# Patient Record
Sex: Female | Born: 1990
Health system: Southern US, Community
[De-identification: ages and names within clinical notes are randomized; demographics above are authoritative.]

## PROBLEM LIST (undated history)

## (undated) DIAGNOSIS — A64 Unspecified sexually transmitted disease: Secondary | ICD-10-CM

## (undated) DIAGNOSIS — Z98891 History of uterine scar from previous surgery: Secondary | ICD-10-CM

## (undated) DIAGNOSIS — A4902 Methicillin resistant Staphylococcus aureus infection, unspecified site: Secondary | ICD-10-CM

## (undated) HISTORY — PX: WISDOM TOOTH EXTRACTION: SHX21

## (undated) HISTORY — DX: Unspecified sexually transmitted disease: A64

---

## 2013-01-31 ENCOUNTER — Other Ambulatory Visit (HOSPITAL_COMMUNITY)
Admission: RE | Admit: 2013-01-31 | Discharge: 2013-01-31 | Disposition: A | Discharge status: Home / Self Care | Payer: BC Managed Care – PPO | Source: Ambulatory Visit | Attending: Emergency Medicine | Admitting: Emergency Medicine

## 2013-01-31 ENCOUNTER — Encounter (HOSPITAL_COMMUNITY): Payer: Self-pay | Admitting: Emergency Medicine

## 2013-01-31 ENCOUNTER — Emergency Department (HOSPITAL_COMMUNITY)
Admission: EM | Admit: 2013-01-31 | Discharge: 2013-01-31 | Disposition: A | Discharge status: Home / Self Care | Payer: BC Managed Care – PPO | Source: Home / Self Care | Attending: Emergency Medicine | Admitting: Emergency Medicine

## 2013-01-31 DIAGNOSIS — N760 Acute vaginitis: Secondary | ICD-10-CM

## 2013-01-31 DIAGNOSIS — Z113 Encounter for screening for infections with a predominantly sexual mode of transmission: Secondary | ICD-10-CM | POA: Insufficient documentation

## 2013-01-31 LAB — POCT URINALYSIS DIP (DEVICE)
Bilirubin Urine: NEGATIVE
Hgb urine dipstick: NEGATIVE
Nitrite: NEGATIVE
Specific Gravity, Urine: <=1.005 (ref 1.005–1.030)
pH: 6.5 (ref 5.0–8.0)

## 2013-01-31 MED ORDER — FLUCONAZOLE 150 MG PO TABS: 150.0000 mg | ORAL_TABLET | Freq: Once | ORAL | Status: DC

## 2013-01-31 NOTE — ED Notes (Signed)
States she has discharge and her redness in her vagina.  Patient says she is not itching.

## 2013-01-31 NOTE — Discharge Instructions (Signed)
We will; contact you if any abnormal test results-

## 2013-01-31 NOTE — ED Provider Notes (Addendum)
History    CSN: [id number] Arrival date & time 01/31/13  1901  First MD Initiated Contact with Patient 01/31/13 2022     Chief Complaint  Patient presents with  . Vaginitis   (Consider location/radiation/quality/duration/timing/severity/associated sxs/prior Treatment) HPI Comments: Patient presents to urgent care describing for a few days she has been noticing a whitish discharge and redness in her vagina. Patient does describe that at one point was somewhat itchy but is not itchy anymore. She has very minimal concerns about having been exposed to an STD probe was to be tested for such.  Patient denies any pelvic pain, flank pain or associated urinary symptoms such as increased frequency pain or pressure or burning with urination.  Patient is a 22 y.o. female presenting with vaginal discharge.  Vaginal Discharge Quality:  Mucoid, thin and white Timing:  Constant Progression:  Worsening Context: not after intercourse   Associated symptoms: vaginal itching   Associated symptoms: no abdominal pain, no dyspareunia, no dysuria, no fever, no nausea, no rash and no vomiting   Risk factors: no endometriosis, no immunosuppression, no prior miscarriage, no STI, no STI exposure and no terminated pregnancy    History reviewed. No pertinent past medical history. No past surgical history on file. No family history on file. History  Substance Use Topics  . Smoking status: Not on file  . Smokeless tobacco: Not on file  . Alcohol Use: Not on file   OB History   Grav Para Term Preterm Abortions TAB SAB Ect Mult Living                 Review of Systems  Constitutional: Negative for fever, activity change and appetite change.  Eyes: Negative for photophobia and visual disturbance.  Gastrointestinal: Negative for nausea, vomiting and abdominal pain.  Genitourinary: Positive for vaginal discharge. Negative for dysuria, decreased urine volume, vaginal bleeding, vaginal pain, pelvic pain and  dyspareunia.  Skin: Negative for rash and wound.  Allergic/Immunologic: Negative for immunocompromised state.    Allergies  Review of patient's allergies indicates no known allergies.  Home Medications   Current Outpatient Rx  Name  Route  Sig  Dispense  Refill  . fluconazole (DIFLUCAN) 150 MG tablet   Oral   Take 1 tablet (150 mg total) by mouth once.   2 tablet   0    BP 136/85  Pulse 77  Temp(Src) 98.7 F (37.1 C)  Resp 18  SpO2 100% Physical Exam  Nursing note and vitals reviewed. Constitutional: She appears well-developed and well-nourished.  Genitourinary:    No erythema, tenderness or bleeding around the vagina. No foreign body around the vagina. No signs of injury around the vagina. Vaginal discharge found.  Skin: No rash noted.    ED Course  Procedures (including critical care time) Labs Reviewed  POCT URINALYSIS DIP (DEVICE) - Abnormal; Notable for the following:    Leukocytes, UA TRACE (*)    All other components within normal limits  HERPES SIMPLEX VIRUS CULTURE  POCT PREGNANCY, URINE  CERVICOVAGINAL ANCILLARY ONLY  CERVICOVAGINAL ANCILLARY ONLY   No results found. 1. Vaginitis and vulvovaginitis     MDM  Problem #1 Vaginal discharge and irritation  Exam was highly suggestive of Candida vaginitis. Samples have been sent for other STD screenings as patient has other concerns.  Propose empirical treatment was with Diflucan to be taken once today and to repeat treatment cycle in 7 days.  Patient has been informed that she will be contacted if abnormal  test results will require further treatment or intervention. Patient verbalized understanding and agrees with treatment plan.  Discharge Medication List as of 01/31/2013  8:39 PM    START taking these medications   Details  fluconazole (DIFLUCAN) 150 MG tablet Take 1 tablet (150 mg total) by mouth once., Starting 01/31/2013, Print        Jimmie Molly, MD 01/31/13 2130  Jimmie Molly, MD 02/04/13  [phone]

## 2013-02-03 LAB — HERPES SIMPLEX VIRUS CULTURE

## 2013-02-03 NOTE — ED Notes (Addendum)
GC/Chlamydia neg., Affirm: Candida and Gardnerella pos., Trich neg., Herpes culture: No herpes virus detected.  Pt. adequately treated with Diflucan. Message to Dr. Ladon Applebaum about Gardnerella. Vassie Moselle 02/03/2013

## 2013-02-04 MED ORDER — METRONIDAZOLE 500 MG PO TABS: 500.0000 mg | ORAL_TABLET | Freq: Two times a day (BID) | ORAL | Status: DC

## 2013-02-05 ENCOUNTER — Telehealth (HOSPITAL_COMMUNITY): Payer: Self-pay | Admitting: *Deleted

## 2013-02-05 NOTE — ED Notes (Addendum)
7/12  Dr. Ladon Applebaum entered order for Flagyl, if pt. symptomatic. 7/13 I called pt. and left a message to call. Pt. called back.  Pt. verified x 2 and given results.  She said she still had a little discharge but it is white, thick and clumpy.  I told her that sounds like yeast. She said she took her 2nd Diflucan yesterday. I told her to call back tomorrow. If she still has discharge I will call her Rx. to the [facility] on [address]. Pt. voiced understanding. Vassie Moselle 02/05/2013

## 2013-02-07 NOTE — ED Provider Notes (Signed)
   History    CSN: [id number] Arrival date & time 01/31/13  1901  First MD Initiated Contact with Patient 01/31/13 2022     Chief Complaint  Patient presents with  . Vaginitis   (Consider location/radiation/quality/duration/timing/severity/associated sxs/prior Treatment) HPI History reviewed. No pertinent past medical history. No past surgical history on file. No family history on file. History  Substance Use Topics  . Smoking status: Not on file  . Smokeless tobacco: Not on file  . Alcohol Use: Not on file   OB History   Grav Para Term Preterm Abortions TAB SAB Ect Mult Living                 Review of Systems  Allergies  Review of patient's allergies indicates no known allergies.  Home Medications   Current Outpatient Rx  Name  Route  Sig  Dispense  Refill  . fluconazole (DIFLUCAN) 150 MG tablet   Oral   Take 1 tablet (150 mg total) by mouth once.   2 tablet   0   . metroNIDAZOLE (FLAGYL) 500 MG tablet   Oral   Take 1 tablet (500 mg total) by mouth 2 (two) times daily.   14 tablet   0    BP 136/85  Pulse 77  Temp(Src) 98.7 F (37.1 C)  Resp 18  SpO2 100% Physical Exam  ED Course  Procedures (including critical care time) Labs Reviewed  POCT URINALYSIS DIP (DEVICE) - Abnormal; Notable for the following:    Leukocytes, UA TRACE (*)    All other components within normal limits  HERPES SIMPLEX VIRUS CULTURE  POCT PREGNANCY, URINE  CERVICOVAGINAL ANCILLARY ONLY  CERVICOVAGINAL ANCILLARY ONLY   No results found. 1. Vaginitis and vulvovaginitis     MDM  Result of BV given to me by Rosalita Chessman, RN. WIll tx with flagyl 500 bid for 7 d DMabe, NP  Hayden Rasmussen, NP 02/09/13 1351

## 2013-02-11 ENCOUNTER — Ambulatory Visit (INDEPENDENT_AMBULATORY_CARE_PROVIDER_SITE_OTHER): Payer: BC Managed Care – PPO | Admitting: Family Medicine

## 2013-02-11 VITALS — BP 116/70 | HR 104 | Temp 98.0°F | Resp 16 | Ht 62.25 in | Wt 189.0 lb

## 2013-02-11 DIAGNOSIS — Z113 Encounter for screening for infections with a predominantly sexual mode of transmission: Secondary | ICD-10-CM

## 2013-02-11 DIAGNOSIS — Z0000 Encounter for general adult medical examination without abnormal findings: Secondary | ICD-10-CM

## 2013-02-11 LAB — POCT CBC
Granulocyte percent: 61.2 % (ref 37–80)
HCT, POC: 41.7 % (ref 37.7–47.9)
Hemoglobin: 13.1 g/dL (ref 12.2–16.2)
Lymph, poc: 1.9 (ref 0.6–3.4)
MCH, POC: 31.9 pg — AB (ref 27–31.2)
MCHC: 31.4 g/dL — AB (ref 31.8–35.4)
MCV: 101.5 fL — AB (ref 80–97)
MID (cbc): 0.3 (ref 0–0.9)
MPV: 10.5 fL (ref 0–99.8)
POC Granulocyte: 3.5 (ref 2–6.9)
POC LYMPH PERCENT: 33.3 % (ref 10–50)
POC MID %: 5.5 %M (ref 0–12)
Platelet Count, POC: 184 10*3/uL (ref 142–424)
RBC: 4.11 M/uL (ref 4.04–5.48)
RDW, POC: 12.8 %
WBC: 5.7 10*3/uL (ref 4.6–10.2)

## 2013-02-11 LAB — COMPREHENSIVE METABOLIC PANEL
ALT: <8 U/L (ref 0–35)
AST: 12 U/L (ref 0–37)
Alkaline Phosphatase: 47 U/L (ref 39–117)
Creat: 0.88 mg/dL (ref 0.50–1.10)
Sodium: 139 mEq/L (ref 135–145)
Total Bilirubin: 0.4 mg/dL (ref 0.3–1.2)
Total Protein: 6.9 g/dL (ref 6.0–8.3)

## 2013-02-11 LAB — COMPREHENSIVE METABOLIC PANEL WITH GFR
Albumin: 4.3 g/dL (ref 3.5–5.2)
BUN: 8 mg/dL (ref 6–23)
CO2: 22 meq/L (ref 19–32)
Calcium: 9 mg/dL (ref 8.4–10.5)
Chloride: 105 meq/L (ref 96–112)
Glucose, Bld: 78 mg/dL (ref 70–99)
Potassium: 4 meq/L (ref 3.5–5.3)

## 2013-02-11 LAB — RPR

## 2013-02-11 LAB — HIV ANTIBODY (ROUTINE TESTING W REFLEX): HIV: NONREACTIVE

## 2013-02-11 LAB — HEPATITIS B SURFACE ANTIGEN: Hepatitis B Surface Ag: NEGATIVE

## 2013-02-11 LAB — HEPATITIS B SURFACE ANTIBODY, QUANTITATIVE: Hep B S AB Quant (Post): 1000 m[IU]/mL

## 2013-02-11 LAB — HEPATITIS C ANTIBODY: HCV Ab: NEGATIVE

## 2013-02-11 NOTE — Progress Notes (Signed)
[facility]:  Office Visit  Chief Complaint:  Chief Complaint  Patient presents with  . Annual Exam    and paperwork- no PAP    HPI: Kristine [name] is a 22 y.o. female who complains of:   She got her ob/gyn visit with  pap in Dec was normal , got it done in [place] She has been sexually active but not currently, has been on pills since 16 just went off pills, wanted to give body a break from OCP She is G0 She would like STI testing ? 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 No pertinent past medical history. History reviewed. No pertinent past surgical history. History   Social History  . Marital Status: Unknown    Spouse Name: N/A    Number of Children: N/A  . Years of Education: N/A   Social History Main Topics  . Smoking status: Never Smoker   . Smokeless tobacco: None  . Alcohol Use: 1.0 oz/week    2 drink(s) per week  . Drug Use: No  . Sexually Active: Yes    Birth Control/ Protection: Condom   Other Topics Concern  . None   Social History Narrative  . None   Family History  Problem Relation Age of Onset  . Multiple sclerosis Father    No Known Allergies Prior to Admission medications   Medication Sig Start Date End Date Taking? Authorizing Provider  fluconazole (DIFLUCAN) 150 MG tablet Take 1 tablet (150 mg total) by mouth once. 01/31/13   Jimmie Molly, MD  metroNIDAZOLE (FLAGYL) 500 MG tablet Take 1 tablet (500 mg total) by mouth 2 (two) times daily. 02/04/13   Jimmie Molly, MD     ROS: The patient denies fevers, chills, night sweats, unintentional weight loss, chest pain, palpitations, wheezing, dyspnea on exertion, nausea, vomiting, abdominal pain, dysuria, hematuria, melena, numbness, weakness, or tingling.   All other systems have been reviewed and were otherwise negative with the exception of those mentioned in the HPI and as above.    PHYSICAL EXAM: Filed  Vitals:   02/11/13 1327  BP: 116/70  Pulse: 104  Temp: 98 F (36.7 C)  Resp: 16   Filed Vitals:   02/11/13 1327  Height: 5' 2.25" (1.581 m)  Weight: 189 lb (85.73 kg)   Body mass index is 34.3 kg/(m^2).  General: Alert, no acute distress HEENT:  Normocephalic, atraumatic, oropharynx patent.  Cardiovascular:  Regular rate and rhythm, no rubs murmurs or gallops.  No Carotid bruits, radial pulse intact. No pedal edema.  Respiratory: Clear to auscultation bilaterally.  No wheezes, rales, or rhonchi.  No cyanosis, no use of accessory musculature GI: No organomegaly, abdomen is soft and non-tender, positive bowel sounds.  No masses. Skin: No rashes. Neurologic: Facial musculature symmetric. Psychiatric: Patient is appropriate throughout our interaction. Lymphatic: No cervical lymphadenopathy Musculoskeletal: Gait intact.   LABS: Results for orders placed in visit on 02/11/13  COMPREHENSIVE METABOLIC PANEL      Result Value Range   Sodium 139  135 - 145 mEq/L   Potassium 4.0  3.5 - 5.3 mEq/L   Chloride 105  96 - 112 mEq/L   CO2 22  19 - 32 mEq/L   Glucose, Bld 78  70 - 99 mg/dL   BUN 8  6 - 23 mg/dL   Creat 1.61  0.96 - 0.45 mg/dL   Total Bilirubin 0.4  0.3 -  1.2 mg/dL   Alkaline Phosphatase 47  39 - 117 U/L   AST 12  0 - 37 U/L   ALT <8  0 - 35 U/L   Total Protein 6.9  6.0 - 8.3 g/dL   Albumin 4.3  3.5 - 5.2 g/dL   Calcium 9.0  8.4 - 16.1 mg/dL  HEPATITIS B SURFACE ANTIBODY, QUANTITATIVE      Result Value Range   Hepatitis B-Post 1000.0    HEPATITIS B SURFACE ANTIGEN      Result Value Range   Hepatitis B Surface Ag NEGATIVE  NEGATIVE  HEPATITIS C ANTIBODY      Result Value Range   HCV Ab NEGATIVE  NEGATIVE  RPR      Result Value Range   RPR NON REAC  NON REAC  HIV ANTIBODY (ROUTINE TESTING)      Result Value Range   HIV NON REACTIVE  NON REACTIVE  POCT CBC      Result Value Range   WBC 5.7  4.6 - 10.2 K/uL   Lymph, poc 1.9  0.6 - 3.4   POC LYMPH PERCENT 33.3   10 - 50 %L   MID (cbc) 0.3  0 - 0.9   POC MID % 5.5  0 - 12 %M   POC Granulocyte 3.5  2 - 6.9   Granulocyte percent 61.2  37 - 80 %G   RBC 4.11  4.04 - 5.48 M/uL   Hemoglobin 13.1  12.2 - 16.2 g/dL   HCT, POC 09.6  04.5 - 47.9 %   MCV 101.5 (*) 80 - 97 fL   MCH, POC 31.9 (*) 27 - 31.2 pg   MCHC 31.4 (*) 31.8 - 35.4 g/dL   RDW, POC 40.9     Platelet Count, POC 184  142 - 424 K/uL   MPV 10.5  0 - 99.8 fL     EKG/XRAY:   Primary read interpreted by Dr. Conley Rolls at [facility].   ASSESSMENT/PLAN: Encounter Diagnoses  Name Primary?  . Annual physical exam Yes  . Screening for STD (sexually transmitted disease)    Labs pending STD labs pending Form filled out Will not do breast and pap exam since she had recent ob/gyn , patient does not want and she already has had recent one F/u prn    Amarri Michaelson PHUONG, DO 02/12/2013 10:07 AM

## 2013-02-11 NOTE — ED Provider Notes (Signed)
Medical screening examination/treatment/procedure(s) were performed by resident physician or non-physician practitioner and as supervising physician I was immediately available for consultation/collaboration.   Lyda Colcord,Hay DOUGLAS MD.   Bondar D Charlie Char, MD 02/11/13 0932 

## 2013-02-13 LAB — GC/CHLAMYDIA PROBE AMP
CT Probe RNA: NEGATIVE
GC Probe RNA: NEGATIVE

## 2013-03-29 ENCOUNTER — Encounter (HOSPITAL_COMMUNITY): Payer: Self-pay

## 2013-03-29 ENCOUNTER — Emergency Department (HOSPITAL_COMMUNITY)
Admission: EM | Admit: 2013-03-29 | Discharge: 2013-03-29 | Disposition: A | Discharge status: Home / Self Care | Payer: BC Managed Care – PPO | Source: Home / Self Care | Attending: Family Medicine | Admitting: Family Medicine

## 2013-03-29 DIAGNOSIS — S239XXA Sprain of unspecified parts of thorax, initial encounter: Secondary | ICD-10-CM

## 2013-03-29 DIAGNOSIS — H109 Unspecified conjunctivitis: Secondary | ICD-10-CM

## 2013-03-29 LAB — POCT URINALYSIS DIP (DEVICE)
Bilirubin Urine: NEGATIVE
Glucose, UA: NEGATIVE mg/dL
Hgb urine dipstick: NEGATIVE
Leukocytes, UA: NEGATIVE
Nitrite: NEGATIVE
Urobilinogen, UA: 1 mg/dL (ref 0.0–1.0)

## 2013-03-29 MED ORDER — TOBRAMYCIN 0.3 % OP SOLN: 1.0000 [drp] | Freq: Four times a day (QID) | OPHTHALMIC | Status: DC

## 2013-03-29 MED ORDER — METHOCARBAMOL 500 MG PO TABS: 500.0000 mg | ORAL_TABLET | Freq: Three times a day (TID) | ORAL | Status: DC

## 2013-03-29 MED ORDER — IBUPROFEN 800 MG PO TABS: 800.0000 mg | ORAL_TABLET | Freq: Three times a day (TID) | ORAL | Status: DC

## 2013-03-29 NOTE — ED Provider Notes (Signed)
CSN: [id number]     Arrival date & time 03/29/13  1632 History   First MD Initiated Contact with Patient 03/29/13 1655     Chief Complaint  Patient presents with  . Back Pain   (Consider location/radiation/quality/duration/timing/severity/associated sxs/prior Treatment) Patient is a 22 y.o. female presenting with back pain and conjunctivitis. The history is provided by the patient.  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 Relieved by:  Nothing Worsened by:  Nothing tried Ineffective treatments:  Heating pad Associated symptoms: no abdominal pain, no numbness and no paresthesias   Conjunctivitis This is a new problem. The current episode started yesterday. The problem has been gradually worsening (out break at school). Pertinent negatives include no abdominal pain.    History reviewed. No pertinent past medical history. History reviewed. No pertinent past surgical history. Family History  Problem Relation Age of Onset  . Multiple sclerosis Father    History  Substance Use Topics  . Smoking status: Never Smoker   . Smokeless tobacco: Not on file  . Alcohol Use: 1.0 oz/week    2 drink(s) per week   OB History   Grav Para Term Preterm Abortions TAB SAB Ect Mult Living                 Review of Systems  Constitutional: Negative.   Eyes: Positive for discharge and redness. Negative for photophobia.  Gastrointestinal: Negative.  Negative for abdominal pain.  Genitourinary: Negative.   Musculoskeletal: Positive for back pain. Negative for myalgias, joint swelling and gait problem.  Skin: Negative.   Neurological: Negative for numbness and paresthesias.    Allergies  Review of patient's allergies indicates no known  allergies.  Home Medications   Current Outpatient Rx  Name  Route  Sig  Dispense  Refill  . fluconazole (DIFLUCAN) 150 MG tablet   Oral   Take 1 tablet (150 mg total) by mouth once.   2 tablet   0   . ibuprofen (ADVIL,MOTRIN) 800 MG tablet   Oral   Take 1 tablet (800 mg total) by mouth 3 (three) times daily. For back pain   30 tablet   0   . methocarbamol (ROBAXIN) 500 MG tablet   Oral   Take 1 tablet (500 mg total) by mouth 3 (three) times daily. As muscle relaxer   30 tablet   0   . metroNIDAZOLE (FLAGYL) 500 MG tablet   Oral   Take 1 tablet (500 mg total) by mouth 2 (two) times daily.   14 tablet   0   . tobramycin (TOBREX) 0.3 % ophthalmic solution   Left Eye   Place 1 drop into the left eye every 6 (six) hours.   5 mL   0    BP 128/80  Pulse 83  Temp(Src) 98.2 F (36.8 C) (Oral)  Resp 17  SpO2 99% Physical Exam  Nursing note and vitals reviewed. Constitutional: She appears well-developed and well-nourished.  HENT:  Head: Normocephalic.  Right Ear: External ear normal.  Left Ear: External ear normal.  Mouth/Throat: Oropharynx is clear and moist.  Eyes: EOM are normal. Pupils are equal, round, and reactive to light. Right eye exhibits no discharge. Left eye exhibits discharge. Right conjunctiva is not injected. Left conjunctiva is  injected.      ED Course  Procedures (including critical care time) Labs Review Labs Reviewed - No data to display Imaging Review No results found.  MDM   1. Conjunctivitis of left eye   2. Thoracic back sprain, initial encounter        Linna Hoff, MD 03/29/13 1730

## 2013-03-29 NOTE — ED Notes (Signed)
Back pain x 3 weeks, no relief w OTC medications

## 2013-03-29 NOTE — ED Notes (Signed)
Pt.. came by my desk and said she took the Diflucan but never took the Flagyl for bacterial vaginosis in July. She asked if it is it to late to get the medication?  I asked if she had any symptoms. She said just a little discharge but no itching or irritation.  I told her, that it can clear up without any medication. I told her if she had it now, she would have more symptoms than what she has. I told her if her symptoms worsen, she would need to come back and be rechecked at that time. Pt. voiced understanding.

## 2013-04-10 ENCOUNTER — Ambulatory Visit: Payer: BC Managed Care – PPO | Admitting: Obstetrics & Gynecology

## 2013-12-18 ENCOUNTER — Encounter (HOSPITAL_COMMUNITY): Payer: Self-pay | Admitting: Emergency Medicine

## 2013-12-18 ENCOUNTER — Emergency Department (HOSPITAL_COMMUNITY)
Admission: EM | Admit: 2013-12-18 | Discharge: 2013-12-18 | Disposition: A | Discharge status: Home / Self Care | Payer: 59 | Attending: Emergency Medicine | Admitting: Emergency Medicine

## 2013-12-18 DIAGNOSIS — H5711 Ocular pain, right eye: Secondary | ICD-10-CM

## 2013-12-18 DIAGNOSIS — Z792 Long term (current) use of antibiotics: Secondary | ICD-10-CM | POA: Insufficient documentation

## 2013-12-18 DIAGNOSIS — Z79899 Other long term (current) drug therapy: Secondary | ICD-10-CM | POA: Insufficient documentation

## 2013-12-18 DIAGNOSIS — Z791 Long term (current) use of non-steroidal anti-inflammatories (NSAID): Secondary | ICD-10-CM | POA: Insufficient documentation

## 2013-12-18 DIAGNOSIS — H5710 Ocular pain, unspecified eye: Secondary | ICD-10-CM | POA: Insufficient documentation

## 2013-12-18 MED ORDER — LEVOFLOXACIN 0.5 % OP SOLN: 1.0000 [drp] | OPHTHALMIC | Status: DC

## 2013-12-18 MED ORDER — FLUORESCEIN SODIUM 1 MG OP STRP
1.0000 | ORAL_STRIP | Freq: Once | OPHTHALMIC | Status: AC
  Administered 2013-12-18: 1 via OPHTHALMIC
  Filled 2013-12-18: qty 1

## 2013-12-18 MED ORDER — TETRACAINE HCL 0.5 % OP SOLN
1.0000 [drp] | Freq: Once | OPHTHALMIC | Status: AC
  Administered 2013-12-18: 1 [drp] via OPHTHALMIC
  Filled 2013-12-18: qty 2

## 2013-12-18 NOTE — Discharge Instructions (Signed)
Call for a follow up appointment with a Family or Primary Care Provider.  Return if Symptoms worsen.   Take medication as prescribed.  Do not where your contacts until you are release by an eye specialist. Do not scratch your eye.

## 2013-12-18 NOTE — ED Notes (Signed)
Pt states redness to her right eye. Pt states she has had pink eye in the past and this is not pink eye, pt did not notice draining or crust.

## 2013-12-18 NOTE — ED Provider Notes (Signed)
CSN: [id number]     Arrival date & time 12/18/13  2023 History  This chart was scribed for non-physician practitioner Mellody Drown, PA-C working with Juliet Rude. Rubin Payor, MD by Joaquin Music, ED Scribe. This patient was seen in room TR04C/TR04C and the patient's care was started at 8:55 PM .   Chief Complaint  Patient presents with  . Eye Problem   HPI Comments: Kristine [name] is a 23 y.o. female who presents to the Emergency Department complaining of R eye redness that began this morning when she woke up. Reports wearing contacts. States "her eye are feeling weird"; reports thick discharge from R eye and redness. Hx of conjunctivitis.   The history is provided by the patient. No language interpreter was used.   History reviewed. No pertinent past medical history. History reviewed. No pertinent past surgical history. Family History  Problem Relation Age of Onset  . Multiple sclerosis Father    History  Substance Use Topics  . Smoking status: Never Smoker   . Smokeless tobacco: Not on file  . Alcohol Use: 1.0 oz/week    2 drink(s) per week   OB History   Grav Para Term Preterm Abortions TAB SAB Ect Mult Living                 Review of Systems  Eyes: Positive for pain, discharge and redness.  All other systems reviewed and are negative.  Allergies  Review of patient's allergies indicates no known allergies.  Home Medications   Prior to Admission medications   Medication Sig Start Date End Date Taking? Authorizing Provider  fluconazole (DIFLUCAN) 150 MG tablet Take 1 tablet (150 mg total) by mouth once. 01/31/13   Jimmie Molly, MD  ibuprofen (ADVIL,MOTRIN) 800 MG tablet Take 1 tablet (800 mg total) by mouth 3 (three) times daily. For back pain 03/29/13   Linna Hoff, MD  methocarbamol (ROBAXIN) 500 MG tablet Take 1 tablet (500 mg total) by mouth 3 (three) times daily. As muscle relaxer 03/29/13   Linna Hoff, MD  metroNIDAZOLE (FLAGYL) 500 MG tablet Take 1 tablet (500  mg total) by mouth 2 (two) times daily. 02/04/13   Jimmie Molly, MD  tobramycin (TOBREX) 0.3 % ophthalmic solution Place 1 drop into the left eye every 6 (six) hours. 03/29/13   Linna Hoff, MD   BP 132/85  Pulse 102  Temp(Src) 99.2 F (37.3 C) (Oral)  Resp 18  SpO2 100%  Physical Exam  Nursing note and vitals reviewed. Constitutional: She is oriented to person, place, and time. She appears well-developed and well-nourished.  Non-toxic appearance. She does not have a sickly appearance. She does not appear ill. No distress.  HENT:  Head: Normocephalic and atraumatic.  Eyes: EOM are normal. Pupils are equal, round, and reactive to light. Right eye exhibits discharge (clear). Right eye exhibits no hordeolum. No foreign body present in the right eye. Left eye exhibits no discharge. Right conjunctiva is injected. Right conjunctiva has no hemorrhage.  Slit lamp exam:      The right eye shows no corneal ulcer, no foreign body and no fluorescein uptake.  IOP: Left 17-15 Left: 11-12  Neck: Neck supple.  Pulmonary/Chest: Effort normal. No respiratory distress.  Neurological: She is alert and oriented to person, place, and time.  Skin: Skin is warm and dry. She is not diaphoretic.  Psychiatric: She has a normal mood and affect. Her behavior is normal.   ED Course  Procedures (including critical care time)  Labs Review Labs Reviewed - No data to display  Imaging Review No results found.   EKG Interpretation None     MDM   Final diagnoses:  Pain, eye, right   Patient presents with right eye redness and discomfort. Clear drainage on exam no obvious corneal abrasion, no foreign body seen on foot but exam will prescribe patient's eye drops and a half outpatient followup with an eye specialist in the morning. Will treat for corneal abrasion outpatient followup within 24 hours. Advised against wearing contacts until cleared by optometrist.  Meds given in ED:  Medications  tetracaine  (PONTOCAINE) 0.5 % ophthalmic solution 1 drop (1 drop Both Eyes Given 12/18/13 2140)  fluorescein ophthalmic strip 1 strip (1 strip Both Eyes Given 12/18/13 2140)    Discharge Medication List as of 12/18/2013  9:34 PM    START taking these medications   Details  levofloxacin (QUIXIN) 0.5 % ophthalmic solution Place 1 drop into the right eye every 2 (two) hours. One drop in Right eye every 2 hours for 48 hours while you are awake, then 1 drop every 6 hours for 5 days., Starting 12/18/2013, Until Discontinued, Print        I personally performed the services described in this documentation, which was scribed in my presence. The recorded information has been reviewed and is accurate.    Clabe SealLauren M Nidya Bouyer, PA-C 12/20/13 0044  Clabe SealLauren M Ladine Kiper, PA-C 12/20/13 [phone]

## 2013-12-20 NOTE — ED Provider Notes (Signed)
Medical screening examination/treatment/procedure(s) were performed by non-physician practitioner and as supervising physician I was immediately available for consultation/collaboration.   EKG Interpretation None       Lenzie Sandler R. Laurent Cargile, MD 12/20/13 1523 

## 2014-03-27 DIAGNOSIS — A64 Unspecified sexually transmitted disease: Secondary | ICD-10-CM

## 2014-03-27 HISTORY — DX: Unspecified sexually transmitted disease: A64

## 2014-04-17 ENCOUNTER — Ambulatory Visit (INDEPENDENT_AMBULATORY_CARE_PROVIDER_SITE_OTHER): Payer: 59 | Admitting: Nurse Practitioner

## 2014-04-17 ENCOUNTER — Encounter: Payer: Self-pay | Admitting: Nurse Practitioner

## 2014-04-17 VITALS — BP 120/76 | HR 76 | Ht 62.25 in | Wt 178.0 lb

## 2014-04-17 DIAGNOSIS — Z0000 Encounter for general adult medical examination without abnormal findings: Secondary | ICD-10-CM

## 2014-04-17 DIAGNOSIS — A749 Chlamydial infection, unspecified: Secondary | ICD-10-CM

## 2014-04-17 DIAGNOSIS — D62 Acute posthemorrhagic anemia: Secondary | ICD-10-CM

## 2014-04-17 DIAGNOSIS — N920 Excessive and frequent menstruation with regular cycle: Secondary | ICD-10-CM

## 2014-04-17 DIAGNOSIS — Z01419 Encounter for gynecological examination (general) (routine) without abnormal findings: Secondary | ICD-10-CM

## 2014-04-17 DIAGNOSIS — Z113 Encounter for screening for infections with a predominantly sexual mode of transmission: Secondary | ICD-10-CM

## 2014-04-17 HISTORY — DX: Excessive and frequent menstruation with regular cycle: N92.0

## 2014-04-17 HISTORY — DX: Acute posthemorrhagic anemia: D62

## 2014-04-17 LAB — POCT URINALYSIS DIPSTICK
BILIRUBIN UA: NEGATIVE
GLUCOSE UA: NEGATIVE
Ketones, UA: NEGATIVE
Leukocytes, UA: NEGATIVE
NITRITE UA: NEGATIVE
Urobilinogen, UA: NEGATIVE
pH, UA: 6

## 2014-04-17 LAB — HEMOGLOBIN, FINGERSTICK: Hemoglobin, fingerstick: 10.5 g/dL — ABNORMAL LOW (ref 12.0–16.0)

## 2014-04-17 MED ORDER — NORETHIN ACE-ETH ESTRAD-FE 1-20 MG-MCG PO TABS: 1.0000 | ORAL_TABLET | Freq: Every day | ORAL | Status: DC

## 2014-04-17 NOTE — Progress Notes (Signed)
Encounter reviewed by Dr. Brook Silva.  

## 2014-04-17 NOTE — Progress Notes (Signed)
Patient ID: Kristine [name], female   DOB: 1990/08/17, 23 y.o.   MRN: [id number] 23 y.o. G0 Single African American Fe here for annual exam.  Menses last 4 days. Heavy flow for 3.5 days.  Super pad and super plus tampon for 2 days.  No cramps or PMS.  Off OCP for 1-2 years.  On the pill menses last 4-5 days, moderate to light.   Not SA for past 4 months. 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   Patient's last menstrual period was 04/16/2014.          Sexually active: Yes.   Not currently. The current method of family planning is condoms all of the time.    Exercising: Yes.    Works at [employer], exercises there at least twice per week. Smoker:  no  Health Maintenance: Pap:  2013, first pap, normal TDaP:  2010 Gardasil completed 2010 Labs: HB:  10.5  Urine:  Large RBC(menses), trace protein   reports that she has never smoked. She has never used smokeless tobacco. She reports that she drinks about one ounce of alcohol per week. She reports that she does not use illicit drugs.  History reviewed. No pertinent past medical history.  History reviewed. No pertinent past surgical history.  Current Outpatient Prescriptions  Medication Sig Dispense Refill  . norethindrone-ethinyl estradiol (JUNEL FE,GILDESS FE,LOESTRIN FE) 1-20 MG-MCG tablet Take 1 tablet by mouth daily.  3 Package  0   No current facility-administered medications for this visit.    Family History  Problem Relation Age of Onset  . Multiple sclerosis Father   . Hypertension Mother   . Heart disease Maternal Grandmother     ROS:  Pertinent items are noted in HPI.  Otherwise, a comprehensive ROS was negative.  Exam:   BP 120/76  Pulse 76  Ht 5' 2.25" (1.581 m)  Wt 178 lb (80.74 kg)  BMI 32.30 kg/m2  LMP 04/16/2014 Height: 5' 2.25" (158.1 cm)  Ht Readings from Last 3 Encounters:  04/17/14 5' 2.25" (1.581 m)  02/11/13 5' 2.25" (1.581 m)    General appearance: alert, cooperative and appears  stated age Head: Normocephalic, without obvious abnormality, atraumatic Neck: no adenopathy, supple, symmetrical, trachea midline and thyroid normal to inspection and palpation Lungs: clear to auscultation bilaterally Breasts: normal appearance, no masses or tenderness Heart: regular rate and rhythm Abdomen: soft, non-tender; no masses,  no organomegaly Extremities: extremities normal, atraumatic, no cyanosis or edema Skin: Skin color, texture, turgor normal. No rashes or lesions Lymph nodes: Cervical, supraclavicular, and axillary nodes normal. No abnormal inguinal nodes palpated Neurologic: Grossly normal   Pelvic: External genitalia:  no lesions              Urethra:  normal appearing urethra with no masses, tenderness or lesions              Bartholin's and Skene's: normal                 Vagina: normal appearing vagina with normal color and discharge, no lesions, heavy menses flow today              Cervix: anteverted              Pap taken: Yes.   Bimanual Exam:  Uterus:  normal size, contour, position, consistency, mobility, non-tender              Adnexa: no mass, fullness, tenderness  Rectovaginal: Confirms               Anus:  normal sphincter tone, no lesions  A:  Well Woman with normal exam  Contraception needs  History of menorrhagia with anemia  R/O STD's  P:   Reviewed health and wellness pertinent to exam  Pap smear taken today  RX Loestrin X 3 months - reviewed potential side effects, risk and benefits.  Use BUM for a month.    Plan to recheck anemia and progress with OCP in 3 months  She is to take OTC Slow Fe daily  Counseled on breast self exam, STD prevention, diet, exercise, calcium and Vit D  return annually or prn  An After Visit Summary was printed and given to the patient.

## 2014-04-17 NOTE — Patient Instructions (Signed)
General topics  Next pap or exam is  due in 1 year Take a Women's multivitamin Take 1200 mg. of calcium daily - prefer dietary If any concerns in interim to call back  Breast Self-Awareness Practicing breast self-awareness may pick up problems early, prevent significant medical complications, and possibly save your life. By practicing breast self-awareness, you can become familiar with how your breasts look and feel and if your breasts are changing. This allows you to notice changes early. It can also offer you some reassurance that your breast health is good. One way to learn what is normal for your breasts and whether your breasts are changing is to do a breast self-exam. If you find a lump or something that was not present in the past, it is best to contact your caregiver right away. Other findings that should be evaluated by your caregiver include nipple discharge, especially if it is bloody; skin changes or reddening; areas where the skin seems to be pulled in (retracted); or new lumps and bumps. Breast pain is seldom associated with cancer (malignancy), but should also be evaluated by a caregiver. BREAST SELF-EXAM The best time to examine your breasts is 5 7 days after your menstrual period is over.  ExitCare Patient Information 2013 [employer].   Exercise to Stay Healthy Exercise helps you become and stay healthy. EXERCISE IDEAS AND TIPS Choose exercises that:  You enjoy.  Fit into your day. You do not need to exercise really hard to be healthy. You can do exercises at a slow or medium level and stay healthy. You can:  Stretch before and after working out.  Try yoga, Pilates, or tai chi.  Lift weights.  Walk fast, swim, jog, run, climb stairs, bicycle, dance, or rollerskate.  Take aerobic classes. Exercises that burn about 150 calories:  Running 1  miles in 15 minutes.  Playing volleyball for 45 to 60 minutes.  Washing and waxing a car for 45 to 60  minutes.  Playing touch football for 45 minutes.  Walking 1  miles in 35 minutes.  Pushing a stroller 1  miles in 30 minutes.  Playing basketball for 30 minutes.  Raking leaves for 30 minutes.  Bicycling 5 miles in 30 minutes.  Walking 2 miles in 30 minutes.  Dancing for 30 minutes.  Shoveling snow for 15 minutes.  Swimming laps for 20 minutes.  Walking up stairs for 15 minutes.  Bicycling 4 miles in 15 minutes.  Gardening for 30 to 45 minutes.  Jumping rope for 15 minutes.  Washing windows or floors for 45 to 60 minutes. Document Released: 08/15/2010 Document Revised: 10/05/2011 Document Reviewed: 08/15/2010 ExitCare Patient Information 2013 Exit[employer].   Other topics ( that may be useful information):    Sexually Transmitted Disease Sexually transmitted disease (STD) refers to any infection that is passed from person to person during sexual activity. This may happen by way of saliva, semen, blood, vaginal mucus, or urine. Common STDs include:  Gonorrhea.  Chlamydia.  Syphilis.  HIV/AIDS.  Genital herpes.  Hepatitis B and C.  Trichomonas.  Human papillomavirus (HPV).  Pubic lice. CAUSES  An STD may be spread by bacteria, virus, or parasite. A person can get an STD by:  Sexual intercourse with an infected person.  Sharing sex toys with an infected person.  Sharing needles with an infected person.  Having intimate contact with the genitals, mouth, or rectal areas of an infected person. SYMPTOMS  Some people may not have any symptoms, but   they can still pass the infection to others. Different STDs have different symptoms. Symptoms include:  Painful or bloody urination.  Pain in the pelvis, abdomen, vagina, anus, throat, or eyes.  Skin rash, itching, irritation, growths, or sores (lesions). These usually occur in the genital or anal area.  Abnormal vaginal discharge.  Penile discharge in men.  Soft, flesh-colored skin growths in the  genital or anal area.  Fever.  Pain or bleeding during sexual intercourse.  Swollen glands in the groin area.  Yellow skin and eyes (jaundice). This is seen with hepatitis. DIAGNOSIS  To make a diagnosis, your caregiver may:  Take a medical history.  Perform a physical exam.  Take a specimen (culture) to be examined.  Examine a sample of discharge under a microscope.  Perform blood test TREATMENT   Chlamydia, gonorrhea, trichomonas, and syphilis can be cured with antibiotic medicine.  Genital herpes, hepatitis, and HIV can be treated, but not cured, with prescribed medicines. The medicines will lessen the symptoms.  Genital warts from HPV can be treated with medicine or by freezing, burning (electrocautery), or surgery. Warts may come back.  HPV is a virus and cannot be cured with medicine or surgery.However, abnormal areas may be followed very closely by your caregiver and may be removed from the cervix, vagina, or vulva through office procedures or surgery. If your diagnosis is confirmed, your recent sexual partners need treatment. This is true even if they are symptom-free or have a negative culture or evaluation. They should not have sex until their caregiver says it is okay. HOME CARE INSTRUCTIONS  All sexual partners should be informed, tested, and treated for all STDs.  Take your antibiotics as directed. Finish them even if you start to feel better.  Only take over-the-counter or prescription medicines for pain, discomfort, or fever as directed by your caregiver.  Rest.  Eat a balanced diet and drink enough fluids to keep your urine clear or pale yellow.  Do not have sex until treatment is completed and you have followed up with your caregiver. STDs should be checked after treatment.  Keep all follow-up appointments, Pap tests, and blood tests as directed by your caregiver.  Only use latex condoms and water-soluble lubricants during sexual activity. Do not use  petroleum jelly or oils.  Avoid alcohol and illegal drugs.  Get vaccinated for HPV and hepatitis. If you have not received these vaccines in the past, talk to your caregiver about whether one or both might be right for you.  Avoid risky sex practices that can break the skin. The only way to avoid getting an STD is to avoid all sexual activity.Latex condoms and dental dams (for oral sex) will help lessen the risk of getting an STD, but will not completely eliminate the risk. SEEK MEDICAL CARE IF:   You have a fever.  You have any new or worsening symptoms. Document Released: 10/03/2002 Document Revised: 10/05/2011 Document Reviewed: 10/10/2010 [facility] Patient Information 2013 [place].    Domestic Abuse You are being battered or abused if someone close to you hits, pushes, or physically hurts you in any way. You also are being abused if you are forced into activities. You are being sexually abused if you are forced to have sexual contact of any kind. You are being emotionally abused if you are made to feel worthless or if you are constantly threatened. It is important to remember that help is available. No one has the right to abuse you. PREVENTION OF FURTHER  ABUSE  Learn the warning signs of danger. This varies with situations but may include: the use of alcohol, threats, isolation from friends and family, or forced sexual contact. Leave if you feel that violence is going to occur.  If you are attacked or beaten, report it to the police so the abuse is documented. You do not have to press charges. The police can protect you while you or the attackers are leaving. Get the officer's name and badge number and a copy of the report.  Find someone you can trust and tell them what is happening to you: your caregiver, a nurse, clergy member, close friend or family member. Feeling ashamed is natural, but remember that you have done nothing wrong. No one deserves abuse. Document Released:  07/10/2000 Document Revised: 10/05/2011 Document Reviewed: 09/18/2010 ExitCare Patient Information 2013 ExitCare[employer].    How Much is Too Much Alcohol? Drinking too much alcohol can cause injury, accidents, and health problems. These types of problems can include:   Car crashes.  Falls.  Family fighting (domestic violence).  Drowning.  Fights.  Injuries.  Burns.  Damage to certain organs.  Having a baby with birth defects. ONE DRINK CAN BE TOO MUCH WHEN YOU ARE:  Working.  Pregnant or breastfeeding.  Taking medicines. Ask your doctor.  Driving or planning to drive. If you or someone you know has a drinking problem, get help from a doctor.  Document Released: 05/09/2009 Document Revised: 10/05/2011 Document Reviewed: 05/09/2009 ExitCare Patient Information 2013 ExitCare[employer].   Smoking Hazards Smoking cigarettes is extremely bad for your health. Tobacco smoke has over 200 known poisons in it. There are over 60 chemicals in tobacco smoke that cause cancer. Some of the chemicals found in cigarette smoke include:   Cyanide.  Benzene.  Formaldehyde.  Methanol (wood alcohol).  Acetylene (fuel used in welding torches).  Ammonia. Cigarette smoke also contains the poisonous gases nitrogen oxide and carbon monoxide.  Cigarette smokers have an increased risk of many serious medical problems and Smoking causes approximately:  90% of all lung cancer deaths in men.  80% of all lung cancer deaths in women.  90% of deaths from chronic obstructive lung disease. Compared with nonsmokers, smoking increases the risk of:  Coronary heart disease by 2 to 4 times.  Stroke by 2 to 4 times.  Men developing lung cancer by 23 times.  Women developing lung cancer by 13 times.  Dying from chronic obstructive lung diseases by 12 times.  . Smoking is the most preventable cause of death and disease in our society.  WHY IS SMOKING ADDICTIVE?  Nicotine is the chemical  agent in tobacco that is capable of causing addiction or dependence.  When you smoke and inhale, nicotine is absorbed rapidly into the bloodstream through your lungs. Nicotine absorbed through the lungs is capable of creating a powerful addiction. Both inhaled and non-inhaled nicotine may be addictive.  Addiction studies of cigarettes and spit tobacco show that addiction to nicotine occurs mainly during the teen years, when young people begin using tobacco products. WHAT ARE THE BENEFITS OF QUITTING?  There are many health benefits to quitting smoking.   Likelihood of developing cancer and heart disease decreases. Health improvements are seen almost immediately.  Blood pressure, pulse rate, and breathing patterns start returning to normal soon after quitting. QUITTING SMOKING   American Lung Association - 1-800-LUNGUSA  American Cancer Society - 1-800-ACS-2345 Document Released: 08/20/2004 Document Revised: 10/05/2011 Document Reviewed: 04/24/2009 ExitCare Patient Information 2013 ExitCare,   LLC.   Stress Management Stress is a state of physical or mental tension that often results from changes in your life or normal routine. Some common causes of stress are:  Death of a loved one.  Injuries or severe illnesses.  Getting fired or changing jobs.  Moving into a new home. Other causes may be:  Sexual problems.  Business or financial losses.  Taking on a large debt.  Regular conflict with someone at home or at work.  Constant tiredness from lack of sleep. It is not just bad things that are stressful. It may be stressful to:  Win the lottery.  Get married.  Buy a new car. The amount of stress that can be easily tolerated varies from person to person. Changes generally cause stress, regardless of the types of change. Too much stress can affect your health. It may lead to physical or emotional problems. Too little stress (boredom) may also become stressful. SUGGESTIONS TO  REDUCE STRESS:  Talk things over with your family and friends. It often is helpful to share your concerns and worries. If you feel your problem is serious, you may want to get help from a professional counselor.  Consider your problems one at a time instead of lumping them all together. Trying to take care of everything at once may seem impossible. List all the things you need to do and then start with the most important one. Set a goal to accomplish 2 or 3 things each day. If you expect to do too many in a single day you will naturally fail, causing you to feel even more stressed.  Do not use alcohol or drugs to relieve stress. Although you may feel better for a short time, they do not remove the problems that caused the stress. They can also be habit forming.  Exercise regularly - at least 3 times per week. Physical exercise can help to relieve that "uptight" feeling and will relax you.  The shortest distance between despair and hope is often a good night's sleep.  Go to bed and get up on time allowing yourself time for appointments without being rushed.  Take a short "time-out" period from any stressful situation that occurs during the day. Close your eyes and take some deep breaths. Starting with the muscles in your face, tense them, hold it for a few seconds, then relax. Repeat this with the muscles in your neck, shoulders, hand, stomach, back and legs.  Take good care of yourself. Eat a balanced diet and get plenty of rest.  Schedule time for having fun. Take a break from your daily routine to relax. HOME CARE INSTRUCTIONS   Call if you feel overwhelmed by your problems and feel you can no longer manage them on your own.  Return immediately if you feel like hurting yourself or someone else. Document Released: 01/06/2001 Document Revised: 10/05/2011 Document Reviewed: 08/29/2007 ExitCare Patient Information 2013 ExitCare, [employer].  

## 2014-04-18 LAB — STD PANEL
HEP B S AG: NEGATIVE
HIV: NONREACTIVE

## 2014-04-18 LAB — IPS N GONORRHOEA AND CHLAMYDIA BY PCR

## 2014-04-19 ENCOUNTER — Telehealth: Payer: Self-pay

## 2014-04-19 LAB — IPS PAP TEST WITH REFLEX TO HPV

## 2014-04-19 MED ORDER — AZITHROMYCIN 1 G PO PACK: 1.0000 | PACK | Freq: Once | ORAL | Status: DC

## 2014-04-19 NOTE — Telephone Encounter (Signed)
Left message to call Avenir Lozinski at [phone]. 

## 2014-04-19 NOTE — Telephone Encounter (Signed)
Spoke with patient. Advised of results as seen below. Patient is agreeable and verbalizes understanding. Patient has 3 month recheck appointment for birth control with Lauro Franklin, FNP and will have rescreen at that appointment. Appointment is scheduled for 07/16/14 at 3:30pm. Fax to [facility] with cover sheet and confirmation.  [id number] Rolen-Grubb, FNP   Routing to provider for final review. Patient agreeable to disposition. Will close encounter

## 2014-04-19 NOTE — Addendum Note (Signed)
Addended by: Verner Chol on: 04/19/2014 08:58 AM   Modules accepted: Orders

## 2014-04-19 NOTE — Telephone Encounter (Signed)
Patient is returning a calling to [place]. Patient says you can leave details on her voicemail.

## 2014-04-19 NOTE — Telephone Encounter (Signed)
Message copied by Jannet Askew on Thu Apr 19, 2014 10:05 AM ------      Message from: Verner Chol      Created: Thu Apr 19, 2014  8:57 AM       Notify patient that Hep. B,HIV,RPR and gonorrhea negative      Chlamydia positive needs treatment with Azithromycin order in      Needs rescreen 3 months please schedule      Partner needs notification and treatment ------

## 2014-06-27 ENCOUNTER — Telehealth: Payer: Self-pay | Admitting: Obstetrics and Gynecology

## 2014-06-27 NOTE — Telephone Encounter (Signed)
Left message regarding upcoming appointment with Shirlyn Goltzatty Grubb has been canceled and needs to be rescheduled

## 2014-07-16 ENCOUNTER — Ambulatory Visit: Payer: 59 | Admitting: Nurse Practitioner

## 2014-07-17 ENCOUNTER — Encounter: Payer: Self-pay | Admitting: Nurse Practitioner

## 2014-07-17 ENCOUNTER — Ambulatory Visit (INDEPENDENT_AMBULATORY_CARE_PROVIDER_SITE_OTHER): Payer: BC Managed Care – PPO | Admitting: Nurse Practitioner

## 2014-07-17 VITALS — BP 110/66 | HR 64 | Ht 62.25 in | Wt 176.0 lb

## 2014-07-17 DIAGNOSIS — N939 Abnormal uterine and vaginal bleeding, unspecified: Secondary | ICD-10-CM

## 2014-07-17 DIAGNOSIS — Z3041 Encounter for surveillance of contraceptive pills: Secondary | ICD-10-CM

## 2014-07-17 NOTE — Patient Instructions (Signed)
Depo provera Medroxyprogesterone injection [Contraceptive] What is this medicine? MEDROXYPROGESTERONE (me DROX ee proe JES te rone) contraceptive injections prevent pregnancy. They provide effective birth control for 3 months. Depo-subQ Provera 104 is also used for treating pain related to endometriosis. This medicine may be used for other purposes; ask your health care provider or pharmacist if you have questions. COMMON BRAND NAME(S): Depo-Provera, Depo-subQ Provera 104 What should I tell my health care provider before I take this medicine? They need to know if you have any of these conditions: -frequently drink alcohol -asthma -blood vessel disease or a history of a blood clot in the lungs or legs -bone disease such as osteoporosis -breast cancer -diabetes -eating disorder (anorexia nervosa or bulimia) -high blood pressure -HIV infection or AIDS -kidney disease -liver disease -mental depression -migraine -seizures (convulsions) -stroke -tobacco smoker -vaginal bleeding -an unusual or allergic reaction to medroxyprogesterone, other hormones, medicines, foods, dyes, or preservatives -pregnant or trying to get pregnant -breast-feeding How should I use this medicine? Depo-Provera Contraceptive injection is given into a muscle. Depo-subQ Provera 104 injection is given under the skin. These injections are given by a health care professional. You must not be pregnant before getting an injection. The injection is usually given during the first 5 days after the start of a menstrual period or 6 weeks after delivery of a baby. Talk to your pediatrician regarding the use of this medicine in children. Special care may be needed. These injections have been used in female children who have started having menstrual periods. Overdosage: If you think you have taken too much of this medicine contact a poison control center or emergency room at once. NOTE: This medicine is only for you. Do not share  this medicine with others. What if I miss a dose? Try not to miss a dose. You must get an injection once every 3 months to maintain birth control. If you cannot keep an appointment, call and reschedule it. If you wait longer than 13 weeks between Depo-Provera contraceptive injections or longer than 14 weeks between Depo-subQ Provera 104 injections, you could get pregnant. Use another method for birth control if you miss your appointment. You may also need a pregnancy test before receiving another injection. What may interact with this medicine? Do not take this medicine with any of the following medications: -bosentan This medicine may also interact with the following medications: -aminoglutethimide -antibiotics or medicines for infections, especially rifampin, rifabutin, rifapentine, and griseofulvin -aprepitant -barbiturate medicines such as phenobarbital or primidone -bexarotene -carbamazepine -medicines for seizures like ethotoin, felbamate, oxcarbazepine, phenytoin, topiramate -modafinil -St. John's wort This list may not describe all possible interactions. Give your health care provider a list of all the medicines, herbs, non-prescription drugs, or dietary supplements you use. Also tell them if you smoke, drink alcohol, or use illegal drugs. Some items may interact with your medicine. What should I watch for while using this medicine? This drug does not protect you against HIV infection (AIDS) or other sexually transmitted diseases. Use of this product may cause you to lose calcium from your bones. Loss of calcium may cause weak bones (osteoporosis). Only use this product for more than 2 years if other forms of birth control are not right for you. The longer you use this product for birth control the more likely you will be at risk for weak bones. Ask your health care professional how you can keep strong bones. You may have a change in bleeding pattern or irregular periods. Many females  stop having periods while taking this drug. If you have received your injections on time, your chance of being pregnant is very low. If you think you may be pregnant, see your health care professional as soon as possible. Tell your health care professional if you want to get pregnant within the next year. The effect of this medicine may last a long time after you get your last injection. What side effects may I notice from receiving this medicine? Side effects that you should report to your doctor or health care professional as soon as possible: -allergic reactions like skin rash, itching or hives, swelling of the face, lips, or tongue -breast tenderness or discharge -breathing problems -changes in vision -depression -feeling faint or lightheaded, falls -fever -pain in the abdomen, chest, groin, or leg -problems with balance, talking, walking -unusually weak or tired -yellowing of the eyes or skin Side effects that usually do not require medical attention (report to your doctor or health care professional if they continue or are bothersome): -acne -fluid retention and swelling -headache -irregular periods, spotting, or absent periods -temporary pain, itching, or skin reaction at site where injected -weight gain This list may not describe all possible side effects. Call your doctor for medical advice about side effects. You may report side effects to FDA at 1-800-FDA-1088. Where should I keep my medicine? This does not apply. The injection will be given to you by a health care professional. NOTE: This sheet is a summary. It may not cover all possible information. If you have questions about this medicine, talk to your doctor, pharmacist, or health care provider.  2015, Elsevier/Gold Standard. (2008-08-03 18:37:56)

## 2014-07-17 NOTE — Progress Notes (Signed)
23 y.o. S AA female G0P0 here for follow up of OCP.  She has been on June Fe 1/20 since September.  She has had a lot of BTB at various times during the month.  States she is compliant and  Does not like this pill.  She had been on another OCP in the past and did OK but insurance did not cover.  She did the best with  Depo Provera.   She took that med teens for about 5-6 years and has been off for over 4 years.   LMP 05/26/14, 06/12/14; 06/27/14; 12/13.  Each cycle lasted about 4 days.  Last SA 03/2014.  Due to start menses last week of December and has requested to go back on Depo Provera.    O: Exam not done today  A: AUB on OCP  Will plan to go back on Depo Provera   P:  Discussed SE of Depo Provera and she wishes to restart  Instructions given regarding:  Depo Provera  After patient left I realized we should have done TOC for Chlamydia.  When she return for her first Depo Provera within a week - will do TOC then.  She is called and notified and is OK to get done then.  RV

## 2014-07-23 NOTE — Progress Notes (Signed)
Encounter reviewed by Dr. Fe Okubo Silva.  

## 2014-12-19 ENCOUNTER — Telehealth: Payer: Self-pay | Admitting: *Deleted

## 2014-12-19 NOTE — Telephone Encounter (Signed)
-----   Message from Lauro FranklinPatricia Rolen-Grubb, FNP sent at 09/20/2014  5:31 PM EST ----- yes ----- Message -----    From: Luisa DagoStephanie C Jimi Schappert, CMA    Sent: 09/18/2014  10:49 AM      To: Lauro FranklinPatricia Rolen-Grubb, FNP  Pt has not called to schedule depo.  Do you want me to call her to see if she is still considering and to get [facility]?  steph   ----- Message -----    From: Lauro FranklinPatricia Rolen-Grubb, FNP    Sent: 07/22/2014  11:08 AM      To: Luisa DagoStephanie C Londen Lorge, CMA  OK for "dirty urine TOC" - since it was my mistake. ----- Message -----    From: Luisa DagoStephanie C Izaan Kingbird, CMA    Sent: 07/18/2014   9:06 AM      To: Lauro FranklinPatricia Rolen-Grubb, FNP  Do you want cervical TOC or urine?  We'll need to make sure she's on the right schedule.  ----- Message -----    From: Lauro FranklinPatricia Rolen-Grubb, FNP    Sent: 07/17/2014   7:13 PM      To: Luisa DagoStephanie C Monica Codd, CMA  When she came in today I thought this was for OCP/ BTB.  When she was undressed I thought this was checking for BTB so told her I did not need to do pelvic.  After she left I realized what that was for and called her.  When she comes in for Depo within a week most likely will need to get TOC for chlamydia.  She is aware and is to tell someone but can you flag this chart?

## 2014-12-19 NOTE — Telephone Encounter (Signed)
I have attempted to contact this patient by phone with the following results: left message to return my call on answering machine (mobile per DPR).  [phone] (Mobile)

## 2015-04-29 ENCOUNTER — Ambulatory Visit: Payer: 59 | Admitting: Nurse Practitioner

## 2015-05-03 ENCOUNTER — Ambulatory Visit (INDEPENDENT_AMBULATORY_CARE_PROVIDER_SITE_OTHER): Payer: BLUE CROSS/BLUE SHIELD | Admitting: Nurse Practitioner

## 2015-05-03 ENCOUNTER — Encounter: Payer: Self-pay | Admitting: Nurse Practitioner

## 2015-05-03 VITALS — BP 116/80 | HR 76 | Ht 62.0 in | Wt 166.0 lb

## 2015-05-03 DIAGNOSIS — Z0000 Encounter for general adult medical examination without abnormal findings: Secondary | ICD-10-CM | POA: Diagnosis not present

## 2015-05-03 DIAGNOSIS — Z01419 Encounter for gynecological examination (general) (routine) without abnormal findings: Secondary | ICD-10-CM | POA: Diagnosis not present

## 2015-05-03 DIAGNOSIS — Z113 Encounter for screening for infections with a predominantly sexual mode of transmission: Secondary | ICD-10-CM

## 2015-05-03 LAB — POCT URINALYSIS DIPSTICK
BILIRUBIN UA: NEGATIVE
Glucose, UA: NEGATIVE
Ketones, UA: NEGATIVE
Leukocytes, UA: NEGATIVE
NITRITE UA: NEGATIVE
PH UA: 6
PROTEIN UA: NEGATIVE
RBC UA: NEGATIVE
UROBILINOGEN UA: NEGATIVE

## 2015-05-03 LAB — HEMOGLOBIN, FINGERSTICK: HEMOGLOBIN, FINGERSTICK: 11.8 g/dL — AB (ref 12.0–16.0)

## 2015-05-03 MED ORDER — DESOGESTREL-ETHINYL ESTRADIOL 0.15-0.02/0.01 MG (21/5) PO TABS: 1.0000 | ORAL_TABLET | Freq: Every day | ORAL | Status: DC

## 2015-05-03 MED ORDER — DESOGESTREL-ETHINYL ESTRADIOL 0.15-0.02/0.01 MG (21/5) PO TABS: 3.0000 | ORAL_TABLET | Freq: Every day | ORAL | Status: DC

## 2015-05-03 NOTE — Progress Notes (Signed)
Patient ID: Kristine [name], female   DOB: 09-24-1990, 24 y.o.   MRN: [id number] 24 y.o. G0P0 Single  African American Fe here for annual exam.  Currently using condoms occasionally .  Wants to restart on OCP bur a different pill - did not like Loestrin.  She had a lot of BTB.  New partner for 7 months.  Menses 4-5 days.  Flow is heavy for 2 days with a lot of blood clots using super plus tampon changing every 2 hours. then moderate for 2 more days.  Patient's last menstrual period was 04/07/2015 (exact date).          Sexually active: Yes.    The current method of family planning is condoms sometimes.    Exercising: Yes.    Gym/ health club routine includes cardio and strength training.  Exercises 3-4 days per week. Smoker:  no  Health Maintenance: Pap:04/17/14, Negative  TDaP: 2010 Gardasil: Completed  2010 Labs: HB: 11.8   Urine: negative    reports that she has never smoked. She has never used smokeless tobacco. She reports that she drinks about 1.0 oz of alcohol per week. She reports that she does not use illicit drugs.  Past Medical History  Diagnosis Date  . STD (sexually transmitted disease) 03/2014    Chlamydia    History reviewed. No pertinent past surgical history.  Current Outpatient Prescriptions  Medication Sig Dispense Refill  . ferrous sulfate (SLOW FE) 160 (50 FE) MG TBCR SR tablet Take 1 tablet by mouth daily.     No current facility-administered medications for this visit.    Family History  Problem Relation Age of Onset  . Multiple sclerosis Father   . Hypertension Mother   . Heart disease Maternal Grandmother     ROS:  Pertinent items are noted in HPI.  Otherwise, a comprehensive ROS was negative.  Exam:   BP 116/80 mmHg  Pulse 76  Ht  (1.575 m)  Wt 166 lb (75.297 kg)  BMI 30.35 kg/m2  LMP 04/07/2015 (Exact Date) Height:  (157.5 cm) Ht Readings from Last 3 Encounters:  05/03/15  (1.575 m)  07/17/14 5' 2.25" (1.581 m)  04/17/14 5' 2.25"  (1.581 m)    General appearance: alert, cooperative and appears stated age Head: Normocephalic, without obvious abnormality, atraumatic Neck: no adenopathy, supple, symmetrical, trachea midline and thyroid normal to inspection and palpation Lungs: clear to auscultation bilaterally Breasts: normal appearance, no masses or tenderness Heart: regular rate and rhythm Abdomen: soft, non-tender; no masses,  no organomegaly Extremities: extremities normal, atraumatic, no cyanosis or edema Skin: Skin color, texture, turgor normal. No rashes or lesions Lymph nodes: Cervical, supraclavicular, and axillary nodes normal. No abnormal inguinal nodes palpated Neurologic: Grossly normal   Pelvic: External genitalia:  no lesions              Urethra:  normal appearing urethra with no masses, tenderness or lesions              Bartholin's and Skene's: normal                 Vagina: normal appearing vagina with normal color and thin white discharge, no lesions              Cervix: anteverted              Pap taken: No. Bimanual Exam:  Uterus:  normal size, contour, position, consistency, mobility, non-tender  Adnexa: no mass, fullness, tenderness               Rectovaginal: Confirms               Anus:  normal sphincter tone, no lesions  Chaperone present: yes  A:  Well Woman with normal exam  Contraception needs - wants to retry OCP History of menorrhagia with anemia - improved R/O STD's   P:   Reviewed health and wellness pertinent to exam  Pap smear as above  Will change Loestrin to Kariva 1 tablet daily started with first day of menses and recheck in 3 months  Continue with Slow Fe daily  Follow with STD's  Counseled on breast self exam, STD prevention, HIV risk factors and prevention, use and side effects of OCP's, adequate intake of calcium and vitamin D, diet and exercise return annually or prn  An After Visit Summary was printed and given to the  patient.

## 2015-05-03 NOTE — Telephone Encounter (Signed)
Discussed with patient at annual exam visit today.

## 2015-05-03 NOTE — Patient Instructions (Signed)
General topics  Next pap or exam is  due in 1 year Take a Women's multivitamin Take 1200 mg. of calcium daily - prefer dietary If any concerns in interim to call back  Breast Self-Awareness Practicing breast self-awareness may pick up problems early, prevent significant medical complications, and possibly save your life. By practicing breast self-awareness, you can become familiar with how your breasts look and feel and if your breasts are changing. This allows you to notice changes early. It can also offer you some reassurance that your breast health is good. One way to learn what is normal for your breasts and whether your breasts are changing is to do a breast self-exam. If you find a lump or something that was not present in the past, it is best to contact your caregiver right away. Other findings that should be evaluated by your caregiver include nipple discharge, especially if it is bloody; skin changes or reddening; areas where the skin seems to be pulled in (retracted); or new lumps and bumps. Breast pain is seldom associated with cancer (malignancy), but should also be evaluated by a caregiver. BREAST SELF-EXAM The best time to examine your breasts is 5 7 days after your menstrual period is over.  ExitCare Patient Information 2013 [employer].   Exercise to Stay Healthy Exercise helps you become and stay healthy. EXERCISE IDEAS AND TIPS Choose exercises that:  You enjoy.  Fit into your day. You do not need to exercise really hard to be healthy. You can do exercises at a slow or medium level and stay healthy. You can:  Stretch before and after working out.  Try yoga, Pilates, or tai chi.  Lift weights.  Walk fast, swim, jog, run, climb stairs, bicycle, dance, or rollerskate.  Take aerobic classes. Exercises that burn about 150 calories:  Running 1  miles in 15 minutes.  Playing volleyball for 45 to 60 minutes.  Washing and waxing a car for 45 to 60  minutes.  Playing touch football for 45 minutes.  Walking 1  miles in 35 minutes.  Pushing a stroller 1  miles in 30 minutes.  Playing basketball for 30 minutes.  Raking leaves for 30 minutes.  Bicycling 5 miles in 30 minutes.  Walking 2 miles in 30 minutes.  Dancing for 30 minutes.  Shoveling snow for 15 minutes.  Swimming laps for 20 minutes.  Walking up stairs for 15 minutes.  Bicycling 4 miles in 15 minutes.  Gardening for 30 to 45 minutes.  Jumping rope for 15 minutes.  Washing windows or floors for 45 to 60 minutes. Document Released: 08/15/2010 Document Revised: 10/05/2011 Document Reviewed: 08/15/2010 ExitCare Patient Information 2013 Exit[employer].   Other topics ( that may be useful information):    Sexually Transmitted Disease Sexually transmitted disease (STD) refers to any infection that is passed from person to person during sexual activity. This may happen by way of saliva, semen, blood, vaginal mucus, or urine. Common STDs include:  Gonorrhea.  Chlamydia.  Syphilis.  HIV/AIDS.  Genital herpes.  Hepatitis B and C.  Trichomonas.  Human papillomavirus (HPV).  Pubic lice. CAUSES  An STD may be spread by bacteria, virus, or parasite. A person can get an STD by:  Sexual intercourse with an infected person.  Sharing sex toys with an infected person.  Sharing needles with an infected person.  Having intimate contact with the genitals, mouth, or rectal areas of an infected person. SYMPTOMS  Some people may not have any symptoms, but   they can still pass the infection to others. Different STDs have different symptoms. Symptoms include:  Painful or bloody urination.  Pain in the pelvis, abdomen, vagina, anus, throat, or eyes.  Skin rash, itching, irritation, growths, or sores (lesions). These usually occur in the genital or anal area.  Abnormal vaginal discharge.  Penile discharge in men.  Soft, flesh-colored skin growths in the  genital or anal area.  Fever.  Pain or bleeding during sexual intercourse.  Swollen glands in the groin area.  Yellow skin and eyes (jaundice). This is seen with hepatitis. DIAGNOSIS  To make a diagnosis, your caregiver may:  Take a medical history.  Perform a physical exam.  Take a specimen (culture) to be examined.  Examine a sample of discharge under a microscope.  Perform blood test TREATMENT   Chlamydia, gonorrhea, trichomonas, and syphilis can be cured with antibiotic medicine.  Genital herpes, hepatitis, and HIV can be treated, but not cured, with prescribed medicines. The medicines will lessen the symptoms.  Genital warts from HPV can be treated with medicine or by freezing, burning (electrocautery), or surgery. Warts may come back.  HPV is a virus and cannot be cured with medicine or surgery.However, abnormal areas may be followed very closely by your caregiver and may be removed from the cervix, vagina, or vulva through office procedures or surgery. If your diagnosis is confirmed, your recent sexual partners need treatment. This is true even if they are symptom-free or have a negative culture or evaluation. They should not have sex until their caregiver says it is okay. HOME CARE INSTRUCTIONS  All sexual partners should be informed, tested, and treated for all STDs.  Take your antibiotics as directed. Finish them even if you start to feel better.  Only take over-the-counter or prescription medicines for pain, discomfort, or fever as directed by your caregiver.  Rest.  Eat a balanced diet and drink enough fluids to keep your urine clear or pale yellow.  Do not have sex until treatment is completed and you have followed up with your caregiver. STDs should be checked after treatment.  Keep all follow-up appointments, Pap tests, and blood tests as directed by your caregiver.  Only use latex condoms and water-soluble lubricants during sexual activity. Do not use  petroleum jelly or oils.  Avoid alcohol and illegal drugs.  Get vaccinated for HPV and hepatitis. If you have not received these vaccines in the past, talk to your caregiver about whether one or both might be right for you.  Avoid risky sex practices that can break the skin. The only way to avoid getting an STD is to avoid all sexual activity.Latex condoms and dental dams (for oral sex) will help lessen the risk of getting an STD, but will not completely eliminate the risk. SEEK MEDICAL CARE IF:   You have a fever.  You have any new or worsening symptoms. Document Released: 10/03/2002 Document Revised: 10/05/2011 Document Reviewed: 10/10/2010 [facility] Patient Information 2013 [place].    Domestic Abuse You are being battered or abused if someone close to you hits, pushes, or physically hurts you in any way. You also are being abused if you are forced into activities. You are being sexually abused if you are forced to have sexual contact of any kind. You are being emotionally abused if you are made to feel worthless or if you are constantly threatened. It is important to remember that help is available. No one has the right to abuse you. PREVENTION OF FURTHER  ABUSE  Learn the warning signs of danger. This varies with situations but may include: the use of alcohol, threats, isolation from friends and family, or forced sexual contact. Leave if you feel that violence is going to occur.  If you are attacked or beaten, report it to the police so the abuse is documented. You do not have to press charges. The police can protect you while you or the attackers are leaving. Get the officer's name and badge number and a copy of the report.  Find someone you can trust and tell them what is happening to you: your caregiver, a nurse, clergy member, close friend or family member. Feeling ashamed is natural, but remember that you have done nothing wrong. No one deserves abuse. Document Released:  07/10/2000 Document Revised: 10/05/2011 Document Reviewed: 09/18/2010 ExitCare Patient Information 2013 ExitCare[employer].    How Much is Too Much Alcohol? Drinking too much alcohol can cause injury, accidents, and health problems. These types of problems can include:   Car crashes.  Falls.  Family fighting (domestic violence).  Drowning.  Fights.  Injuries.  Burns.  Damage to certain organs.  Having a baby with birth defects. ONE DRINK CAN BE TOO MUCH WHEN YOU ARE:  Working.  Pregnant or breastfeeding.  Taking medicines. Ask your doctor.  Driving or planning to drive. If you or someone you know has a drinking problem, get help from a doctor.  Document Released: 05/09/2009 Document Revised: 10/05/2011 Document Reviewed: 05/09/2009 ExitCare Patient Information 2013 ExitCare[employer].   Smoking Hazards Smoking cigarettes is extremely bad for your health. Tobacco smoke has over 200 known poisons in it. There are over 60 chemicals in tobacco smoke that cause cancer. Some of the chemicals found in cigarette smoke include:   Cyanide.  Benzene.  Formaldehyde.  Methanol (wood alcohol).  Acetylene (fuel used in welding torches).  Ammonia. Cigarette smoke also contains the poisonous gases nitrogen oxide and carbon monoxide.  Cigarette smokers have an increased risk of many serious medical problems and Smoking causes approximately:  90% of all lung cancer deaths in men.  80% of all lung cancer deaths in women.  90% of deaths from chronic obstructive lung disease. Compared with nonsmokers, smoking increases the risk of:  Coronary heart disease by 2 to 4 times.  Stroke by 2 to 4 times.  Men developing lung cancer by 23 times.  Women developing lung cancer by 13 times.  Dying from chronic obstructive lung diseases by 12 times.  . Smoking is the most preventable cause of death and disease in our society.  WHY IS SMOKING ADDICTIVE?  Nicotine is the chemical  agent in tobacco that is capable of causing addiction or dependence.  When you smoke and inhale, nicotine is absorbed rapidly into the bloodstream through your lungs. Nicotine absorbed through the lungs is capable of creating a powerful addiction. Both inhaled and non-inhaled nicotine may be addictive.  Addiction studies of cigarettes and spit tobacco show that addiction to nicotine occurs mainly during the teen years, when young people begin using tobacco products. WHAT ARE THE BENEFITS OF QUITTING?  There are many health benefits to quitting smoking.   Likelihood of developing cancer and heart disease decreases. Health improvements are seen almost immediately.  Blood pressure, pulse rate, and breathing patterns start returning to normal soon after quitting. QUITTING SMOKING   American Lung Association - 1-800-LUNGUSA  American Cancer Society - 1-800-ACS-2345 Document Released: 08/20/2004 Document Revised: 10/05/2011 Document Reviewed: 04/24/2009 ExitCare Patient Information 2013 ExitCare,   LLC.   Stress Management Stress is a state of physical or mental tension that often results from changes in your life or normal routine. Some common causes of stress are:  Death of a loved one.  Injuries or severe illnesses.  Getting fired or changing jobs.  Moving into a new home. Other causes may be:  Sexual problems.  Business or financial losses.  Taking on a large debt.  Regular conflict with someone at home or at work.  Constant tiredness from lack of sleep. It is not just bad things that are stressful. It may be stressful to:  Win the lottery.  Get married.  Buy a new car. The amount of stress that can be easily tolerated varies from person to person. Changes generally cause stress, regardless of the types of change. Too much stress can affect your health. It may lead to physical or emotional problems. Too little stress (boredom) may also become stressful. SUGGESTIONS TO  REDUCE STRESS:  Talk things over with your family and friends. It often is helpful to share your concerns and worries. If you feel your problem is serious, you may want to get help from a professional counselor.  Consider your problems one at a time instead of lumping them all together. Trying to take care of everything at once may seem impossible. List all the things you need to do and then start with the most important one. Set a goal to accomplish 2 or 3 things each day. If you expect to do too many in a single day you will naturally fail, causing you to feel even more stressed.  Do not use alcohol or drugs to relieve stress. Although you may feel better for a short time, they do not remove the problems that caused the stress. They can also be habit forming.  Exercise regularly - at least 3 times per week. Physical exercise can help to relieve that "uptight" feeling and will relax you.  The shortest distance between despair and hope is often a good night's sleep.  Go to bed and get up on time allowing yourself time for appointments without being rushed.  Take a short "time-out" period from any stressful situation that occurs during the day. Close your eyes and take some deep breaths. Starting with the muscles in your face, tense them, hold it for a few seconds, then relax. Repeat this with the muscles in your neck, shoulders, hand, stomach, back and legs.  Take good care of yourself. Eat a balanced diet and get plenty of rest.  Schedule time for having fun. Take a break from your daily routine to relax. HOME CARE INSTRUCTIONS   Call if you feel overwhelmed by your problems and feel you can no longer manage them on your own.  Return immediately if you feel like hurting yourself or someone else. Document Released: 01/06/2001 Document Revised: 10/05/2011 Document Reviewed: 08/29/2007 ExitCare Patient Information 2013 ExitCare, [employer].  

## 2015-05-04 LAB — STD PANEL
HEP B S AG: NEGATIVE
HIV 1&2 Ab, 4th Generation: NONREACTIVE

## 2015-05-04 LAB — WET PREP BY MOLECULAR PROBE
Candida species: NEGATIVE
Gardnerella vaginalis: POSITIVE — AB
Trichomonas vaginosis: NEGATIVE

## 2015-05-04 NOTE — Progress Notes (Signed)
Encounter reviewed by Dr. Brook Amundson C. Silva.  

## 2015-05-06 ENCOUNTER — Other Ambulatory Visit: Payer: Self-pay | Admitting: Nurse Practitioner

## 2015-05-06 MED ORDER — METRONIDAZOLE 0.75 % VA GEL: 1.0000 | Freq: Every day | VAGINAL | Status: DC

## 2015-05-07 LAB — IPS N GONORRHOEA AND CHLAMYDIA BY PCR

## 2015-06-11 LAB — OB RESULTS CONSOLE ABO/RH: RH TYPE: NEGATIVE

## 2015-06-11 LAB — OB RESULTS CONSOLE ANTIBODY SCREEN: Antibody Screen: NEGATIVE

## 2015-06-11 LAB — OB RESULTS CONSOLE RUBELLA ANTIBODY, IGM: RUBELLA: IMMUNE

## 2015-06-11 LAB — OB RESULTS CONSOLE GC/CHLAMYDIA
Chlamydia: NEGATIVE
Gonorrhea: NEGATIVE

## 2015-07-28 DIAGNOSIS — A4902 Methicillin resistant Staphylococcus aureus infection, unspecified site: Secondary | ICD-10-CM

## 2015-07-28 HISTORY — DX: Methicillin resistant Staphylococcus aureus infection, unspecified site: A49.02

## 2015-08-07 ENCOUNTER — Telehealth: Payer: Self-pay | Admitting: Nurse Practitioner

## 2015-08-07 ENCOUNTER — Ambulatory Visit: Payer: BLUE CROSS/BLUE SHIELD | Admitting: Nurse Practitioner

## 2015-08-07 NOTE — Telephone Encounter (Signed)
[facility]. [facility].

## 2015-11-06 DIAGNOSIS — O3123X1 Continuing pregnancy after intrauterine death of one fetus or more, third trimester, fetus 1: Secondary | ICD-10-CM | POA: Diagnosis not present

## 2015-11-06 DIAGNOSIS — Z3A30 30 weeks gestation of pregnancy: Secondary | ICD-10-CM | POA: Diagnosis not present

## 2015-11-19 DIAGNOSIS — Z3A32 32 weeks gestation of pregnancy: Secondary | ICD-10-CM | POA: Diagnosis not present

## 2015-11-19 DIAGNOSIS — R102 Pelvic and perineal pain: Secondary | ICD-10-CM | POA: Diagnosis not present

## 2015-11-20 DIAGNOSIS — Z3A32 32 weeks gestation of pregnancy: Secondary | ICD-10-CM | POA: Diagnosis not present

## 2015-11-20 DIAGNOSIS — Z3403 Encounter for supervision of normal first pregnancy, third trimester: Secondary | ICD-10-CM | POA: Diagnosis not present

## 2015-11-28 ENCOUNTER — Encounter: Payer: Self-pay | Admitting: Nurse Practitioner

## 2015-12-05 DIAGNOSIS — Z3A34 34 weeks gestation of pregnancy: Secondary | ICD-10-CM | POA: Diagnosis not present

## 2015-12-05 DIAGNOSIS — O3123X1 Continuing pregnancy after intrauterine death of one fetus or more, third trimester, fetus 1: Secondary | ICD-10-CM | POA: Diagnosis not present

## 2015-12-05 DIAGNOSIS — R51 Headache: Secondary | ICD-10-CM | POA: Diagnosis not present

## 2015-12-12 DIAGNOSIS — Z36 Encounter for antenatal screening of mother: Secondary | ICD-10-CM | POA: Diagnosis not present

## 2015-12-12 LAB — OB RESULTS CONSOLE GBS: STREP GROUP B AG: NEGATIVE

## 2015-12-27 ENCOUNTER — Inpatient Hospital Stay (HOSPITAL_COMMUNITY)
Admission: AD | Admit: 2015-12-27 | Discharge: 2015-12-27 | Disposition: A | Discharge status: Home / Self Care | Payer: BLUE CROSS/BLUE SHIELD | Source: Ambulatory Visit | Attending: Obstetrics and Gynecology | Admitting: Obstetrics and Gynecology

## 2015-12-27 ENCOUNTER — Encounter (HOSPITAL_COMMUNITY): Payer: Self-pay

## 2015-12-27 DIAGNOSIS — M545 Low back pain: Secondary | ICD-10-CM

## 2015-12-27 DIAGNOSIS — O219 Vomiting of pregnancy, unspecified: Secondary | ICD-10-CM | POA: Diagnosis not present

## 2015-12-27 DIAGNOSIS — R110 Nausea: Secondary | ICD-10-CM | POA: Diagnosis not present

## 2015-12-27 DIAGNOSIS — O2693 Pregnancy related conditions, unspecified, third trimester: Secondary | ICD-10-CM

## 2015-12-27 DIAGNOSIS — O26893 Other specified pregnancy related conditions, third trimester: Secondary | ICD-10-CM | POA: Insufficient documentation

## 2015-12-27 DIAGNOSIS — M549 Dorsalgia, unspecified: Secondary | ICD-10-CM | POA: Insufficient documentation

## 2015-12-27 DIAGNOSIS — Z3A37 37 weeks gestation of pregnancy: Secondary | ICD-10-CM | POA: Insufficient documentation

## 2015-12-27 DIAGNOSIS — O26899 Other specified pregnancy related conditions, unspecified trimester: Secondary | ICD-10-CM

## 2015-12-27 LAB — URINALYSIS, ROUTINE W REFLEX MICROSCOPIC
Bilirubin Urine: NEGATIVE
Glucose, UA: NEGATIVE mg/dL
Hgb urine dipstick: NEGATIVE
KETONES UR: NEGATIVE mg/dL
LEUKOCYTES UA: NEGATIVE
NITRITE: NEGATIVE
PROTEIN: NEGATIVE mg/dL
Specific Gravity, Urine: 1.02 (ref 1.005–1.030)
pH: 5.5 (ref 5.0–8.0)

## 2015-12-27 MED ORDER — PROMETHAZINE HCL 25 MG PO TABS: 25.0000 mg | ORAL_TABLET | Freq: Four times a day (QID) | ORAL | Status: DC | PRN

## 2015-12-27 MED ORDER — PROMETHAZINE HCL 25 MG PO TABS
25.0000 mg | ORAL_TABLET | Freq: Once | ORAL | Status: AC
  Administered 2015-12-27: 25 mg via ORAL
  Filled 2015-12-27: qty 1

## 2015-12-27 NOTE — MAU Note (Signed)
Onset of abdominal pain and nausea since 2:30, headache present all day, back pain since 3:30, denies vaginal bleeding or discharge, no LOF, positive FM.

## 2015-12-27 NOTE — MAU Provider Note (Signed)
MAU HISTORY AND PHYSICAL  Chief Complaint:  Nausea, back pain  Kristine [name] is a 25 y.o.  G1P0 with IUP at 5840w5d presenting for the above.  Biggest concern is nausea and back pain. Nauseaus since this afternoon. No nausea since early pregnancy. No vomiting, no diarrhea. Did have normal bm today. No fever, does have appetite. Experiencing some upper abdominal tightness. No vaginal bleeding, no leakage of fluid. Occasional intermittent contractions for past few weeks. No dysuria or hematuria.  Has some chronic back pain, today worse. Low back, middle radiating to sides. No weakness or numbness. No bowel or bladder dysfunction. No radiation below knees.       Past Medical History  Diagnosis Date  . STD (sexually transmitted disease) 03/2014    Chlamydia  . Anemia     Past Surgical History  Procedure Laterality Date  . Wisdom tooth extraction      Family History  Problem Relation Age of Onset  . Multiple sclerosis Father   . Hypertension Mother   . Heart disease Maternal Grandmother     Social History  Substance Use Topics  . Smoking status: Never Smoker   . Smokeless tobacco: Never Used  . Alcohol Use: 1.0 oz/week    2 Standard drinks or equivalent per week    No Known Allergies  Prescriptions prior to admission  Medication Sig Dispense Refill Last Dose  . Prenatal Vit-Fe Fumarate-FA (MULTIVITAMIN-PRENATAL) 27-0.8 MG TABS tablet Take 1 tablet by mouth daily at 12 noon.   12/26/2015 at Unknown time    Review of Systems - Negative except for what is mentioned in HPI.  Physical Exam  Blood pressure 121/84, pulse 65, temperature 98.2 F (36.8 C), temperature source Oral, resp. rate 18, height 5\' 2"  (1.575 m), weight 214 lb 0.6 oz (97.088 kg), last menstrual period 04/07/2015, SpO2 99 %. GENERAL: Well-developed, well-nourished female in no acute distress.  LUNGS: Clear to auscultation bilaterally.  HEART: Regular rate and rhythm. ABDOMEN: Soft, nontender, nondistended,  gravid.  EXTREMITIES: Nontender, no edema, 2+ distal pulses. Back: diffuse mild low back tenderness, no cva tenderness Neuro: 5/5 lower strength, distal sensation to light tough intact    Labs: Results for orders placed or performed during the hospital encounter of 12/27/15 (from the past 24 hour(s))  Urinalysis, Routine w reflex microscopic (not [facility])   Collection Time: 12/27/15  5:50 PM  Result Value Ref Range   Color, Urine YELLOW YELLOW   APPearance CLEAR CLEAR   Specific Gravity, Urine 1.020 1.005 - 1.030   pH 5.5 5.0 - 8.0   Glucose, UA NEGATIVE NEGATIVE mg/dL   Hgb urine dipstick NEGATIVE NEGATIVE   Bilirubin Urine NEGATIVE NEGATIVE   Ketones, ur NEGATIVE NEGATIVE mg/dL   Protein, ur NEGATIVE NEGATIVE mg/dL   Nitrite NEGATIVE NEGATIVE   Leukocytes, UA NEGATIVE NEGATIVE    Imaging Studies:  No results found.  Assessment/Plan: Kristine [name] is  25 y.o. G1P0 at 6040w5d presents with   #Back pain - chronic problem this pregnancy. No recent trauma. No red flag symptoms. No cva tenderness or cystitis symptoms. Urinalysis unremarkable. Discussed w/ Dr. Jackelyn KnifeMeisinger. - red flag back pain return precautions - tylenol, heat, relative rest, belly band  #Nausea - etiology unclear but well appearing, no abdominal pain, urinalysis unremarkable, normal appetite. - dehydration and abdominal pain return precautions - phenergan prn - ob f/u as scheduled next week   Silvano Bilisoah B Wouk 6/2/20177:10 PM

## 2015-12-27 NOTE — Discharge Instructions (Signed)

## 2015-12-28 NOTE — Progress Notes (Signed)
FHT from yesterday reviewed.  Reactive NST, irreg ctx.

## 2016-01-05 ENCOUNTER — Inpatient Hospital Stay (HOSPITAL_COMMUNITY)
Admission: AD | Admit: 2016-01-05 | Discharge: 2016-01-09 | DRG: 765 | Disposition: A | Discharge status: Home / Self Care | Payer: BLUE CROSS/BLUE SHIELD | Source: Ambulatory Visit | Attending: Obstetrics and Gynecology | Admitting: Obstetrics and Gynecology

## 2016-01-05 ENCOUNTER — Encounter (HOSPITAL_COMMUNITY): Payer: Self-pay | Admitting: Family Medicine

## 2016-01-05 DIAGNOSIS — Z3A38 38 weeks gestation of pregnancy: Secondary | ICD-10-CM | POA: Diagnosis not present

## 2016-01-05 DIAGNOSIS — O26893 Other specified pregnancy related conditions, third trimester: Secondary | ICD-10-CM | POA: Diagnosis present

## 2016-01-05 DIAGNOSIS — Z6791 Unspecified blood type, Rh negative: Secondary | ICD-10-CM | POA: Diagnosis not present

## 2016-01-05 DIAGNOSIS — O4202 Full-term premature rupture of membranes, onset of labor within 24 hours of rupture: Principal | ICD-10-CM | POA: Diagnosis present

## 2016-01-05 DIAGNOSIS — Z23 Encounter for immunization: Secondary | ICD-10-CM | POA: Diagnosis not present

## 2016-01-05 DIAGNOSIS — Z3A39 39 weeks gestation of pregnancy: Secondary | ICD-10-CM | POA: Diagnosis not present

## 2016-01-05 DIAGNOSIS — O411230 Chorioamnionitis, third trimester, not applicable or unspecified: Secondary | ICD-10-CM | POA: Diagnosis not present

## 2016-01-05 DIAGNOSIS — Z8249 Family history of ischemic heart disease and other diseases of the circulatory system: Secondary | ICD-10-CM | POA: Diagnosis not present

## 2016-01-05 DIAGNOSIS — Z820 Family history of epilepsy and other diseases of the nervous system: Secondary | ICD-10-CM | POA: Diagnosis not present

## 2016-01-05 DIAGNOSIS — O4290 Premature rupture of membranes, unspecified as to length of time between rupture and onset of labor, unspecified weeks of gestation: Secondary | ICD-10-CM | POA: Diagnosis present

## 2016-01-05 DIAGNOSIS — O34211 Maternal care for low transverse scar from previous cesarean delivery: Secondary | ICD-10-CM | POA: Diagnosis not present

## 2016-01-05 DIAGNOSIS — Z98891 History of uterine scar from previous surgery: Secondary | ICD-10-CM

## 2016-01-05 HISTORY — DX: History of uterine scar from previous surgery: Z98.891

## 2016-01-05 LAB — CBC
HCT: 35.4 % — ABNORMAL LOW (ref 36.0–46.0)
Hemoglobin: 12.6 g/dL (ref 12.0–15.0)
MCH: 33.9 pg (ref 26.0–34.0)
MCHC: 35.6 g/dL (ref 30.0–36.0)
MCV: 95.2 fL (ref 78.0–100.0)
PLATELETS: 167 10*3/uL (ref 150–400)
RBC: 3.72 MIL/uL — AB (ref 3.87–5.11)
RDW: 12.7 % (ref 11.5–15.5)
WBC: 10.3 10*3/uL (ref 4.0–10.5)

## 2016-01-05 LAB — POCT FERN TEST: POCT Fern Test: POSITIVE

## 2016-01-05 MED ORDER — OXYTOCIN BOLUS FROM INFUSION: 500.0000 mL | INTRAVENOUS | Status: DC

## 2016-01-05 MED ORDER — ONDANSETRON HCL 4 MG/2ML IJ SOLN
4.0000 mg | Freq: Four times a day (QID) | INTRAMUSCULAR | Status: DC | PRN
  Administered 2016-01-06: 4 mg via INTRAVENOUS
  Filled 2016-01-05: qty 2

## 2016-01-05 MED ORDER — ACETAMINOPHEN 325 MG PO TABS
650.0000 mg | ORAL_TABLET | ORAL | Status: DC | PRN
  Administered 2016-01-06: 650 mg via ORAL
  Filled 2016-01-05: qty 2

## 2016-01-05 MED ORDER — LACTATED RINGERS IV SOLN: 500.0000 mL | INTRAVENOUS | Status: DC | PRN

## 2016-01-05 MED ORDER — SOD CITRATE-CITRIC ACID 500-334 MG/5ML PO SOLN
30.0000 mL | ORAL | Status: DC | PRN
  Administered 2016-01-06: 30 mL via ORAL
  Filled 2016-01-05: qty 15

## 2016-01-05 MED ORDER — LACTATED RINGERS IV SOLN: 500.0000 mL | Freq: Once | INTRAVENOUS | Status: DC

## 2016-01-05 MED ORDER — BUTORPHANOL TARTRATE 1 MG/ML IJ SOLN: 1.0000 mg | INTRAMUSCULAR | Status: DC | PRN

## 2016-01-05 MED ORDER — TERBUTALINE SULFATE 1 MG/ML IJ SOLN: 0.2500 mg | Freq: Once | INTRAMUSCULAR | Status: DC | PRN

## 2016-01-05 MED ORDER — OXYTOCIN 40 UNITS IN LACTATED RINGERS INFUSION - SIMPLE MED: 2.5000 [IU]/h | INTRAVENOUS | Status: DC

## 2016-01-05 MED ORDER — PHENYLEPHRINE 40 MCG/ML (10ML) SYRINGE FOR IV PUSH (FOR BLOOD PRESSURE SUPPORT): 80.0000 ug | PREFILLED_SYRINGE | INTRAVENOUS | Status: DC | PRN

## 2016-01-05 MED ORDER — OXYCODONE-ACETAMINOPHEN 5-325 MG PO TABS: 1.0000 | ORAL_TABLET | ORAL | Status: DC | PRN

## 2016-01-05 MED ORDER — OXYCODONE-ACETAMINOPHEN 5-325 MG PO TABS: 2.0000 | ORAL_TABLET | ORAL | Status: DC | PRN

## 2016-01-05 MED ORDER — LIDOCAINE HCL (PF) 1 % IJ SOLN: 30.0000 mL | INTRAMUSCULAR | Status: DC | PRN

## 2016-01-05 MED ORDER — DIPHENHYDRAMINE HCL 50 MG/ML IJ SOLN: 12.5000 mg | INTRAMUSCULAR | Status: DC | PRN

## 2016-01-05 MED ORDER — OXYTOCIN 40 UNITS IN LACTATED RINGERS INFUSION - SIMPLE MED
1.0000 m[IU]/min | INTRAVENOUS | Status: DC
  Administered 2016-01-05: 6 m[IU]/min via INTRAVENOUS
  Administered 2016-01-05: 2 m[IU]/min via INTRAVENOUS
  Administered 2016-01-05: 4 m[IU]/min via INTRAVENOUS
  Administered 2016-01-06: 8 m[IU]/min via INTRAVENOUS
  Filled 2016-01-05: qty 1000

## 2016-01-05 MED ORDER — EPHEDRINE 5 MG/ML INJ
10.0000 mg | INTRAVENOUS | Status: DC | PRN
  Filled 2016-01-05: qty 4

## 2016-01-05 MED ORDER — PHENYLEPHRINE 40 MCG/ML (10ML) SYRINGE FOR IV PUSH (FOR BLOOD PRESSURE SUPPORT)
80.0000 ug | PREFILLED_SYRINGE | INTRAVENOUS | Status: DC | PRN
  Filled 2016-01-05: qty 10

## 2016-01-05 MED ORDER — EPHEDRINE 5 MG/ML INJ
10.0000 mg | INTRAVENOUS | Status: DC | PRN
  Administered 2016-01-06: 10 mg via INTRAVENOUS

## 2016-01-05 MED ORDER — FENTANYL 2.5 MCG/ML BUPIVACAINE 1/10 % EPIDURAL INFUSION (WH - ANES)
14.0000 mL/h | INTRAMUSCULAR | Status: DC | PRN
  Administered 2016-01-06 (×3): 14 mL/h via EPIDURAL
  Filled 2016-01-05 (×3): qty 125

## 2016-01-05 MED ORDER — LACTATED RINGERS IV SOLN
INTRAVENOUS | Status: DC
  Administered 2016-01-05: 20:00:00 via INTRAVENOUS
  Administered 2016-01-06 (×2): 125 mL/h via INTRAVENOUS

## 2016-01-05 NOTE — MAU Note (Signed)
Patient was laying down and heard a pop and then she felt a gush of fluid. Patient states that her water broke at 5pm.

## 2016-01-05 NOTE — H&P (Signed)
Kristine [name] is a 25 y.o. female G1P0 at 2739 0/7 weeks (EDD 01/12/16 by LMP c/w 9 week US)   presenting for SROM confirmed by a positive fern test.  Prenatal care uncomplicated except Rh neegative and received rhogam.   Maternal Medical History:  Reason for admission: Rupture of membranes.   Contractions: Onset was 1-2 hours ago.   Frequency: irregular.   Perceived severity is mild.    Fetal activity: Perceived fetal activity is normal.    Prenatal Complications - Diabetes: none.    OB History    Gravida Para Term Preterm AB TAB SAB Ectopic Multiple Living   1 0             Past Medical History  Diagnosis Date  . STD (sexually transmitted disease) 03/2014    Chlamydia  . Anemia    Past Surgical History  Procedure Laterality Date  . Wisdom tooth extraction     Family History: family history includes Heart disease in her maternal grandmother; Hypertension in her mother; Multiple sclerosis in her father. Social History:  reports that she has never smoked. She has never used smokeless tobacco. She reports that she drinks about 1.0 oz of alcohol per week. She reports that she does not use illicit drugs.   Prenatal Transfer Tool  Maternal Diabetes: No Genetic Screening: Normal Maternal Ultrasounds/Referrals: Normal Fetal Ultrasounds or other Referrals:  None Maternal Substance Abuse:  No Significant Maternal Medications:  None Significant Maternal Lab Results:  Lab values include: Rh negative Other Comments:  None  Review of Systems  Gastrointestinal: Negative for abdominal pain.    Dilation: 2 Effacement (%): 50 Station: -3 Exam by:: Janeth Rasehristina Robinson RN Blood pressure 126/71, pulse 86, temperature 98.4 F (36.9 C), temperature source Oral, resp. rate 18, height 5\' 1"  (1.549 m), weight 214 lb (97.07 kg), last menstrual period 04/07/2015. Maternal Exam:  Uterine Assessment: Contraction strength is mild.  Contraction frequency is irregular.   Abdomen: Patient reports  no abdominal tenderness. Fetal presentation: vertex  Introitus: Normal vulva. Normal vagina.    Physical Exam  Constitutional: She appears well-developed and well-nourished.  Cardiovascular: Normal rate.   Respiratory: Effort normal.  GI: Soft.  Genitourinary: Vagina normal.  Neurological: She is alert.  Psychiatric: She has a normal mood and affect.   Cervix 50/2/-2 per RN exam  Prenatal labs: ABO, Rh:  A negative Antibody:  negative Rubella:  Immune RPR: NON REAC (10/07 1417)  HBsAg: NEGATIVE (10/07 1417)  HIV: NONREACTIVE (10/07 1417)  GBS:   Negative One hour GCT 85 Hgb AA First trimester screen and AFP negative  Assessment/Plan: Pt admitted with SROM and irregular contractions.  Will augment with pitocin and allow epidural when uncomfortable.   Oliver PilaRICHARDSON,Marena Witts W 01/05/2016, 7:34 PM

## 2016-01-05 NOTE — Progress Notes (Signed)
Called MD- notified of FHR, Decel, interventions and SVE with ? Presentation- Will confirm via US before starting Pitocin.

## 2016-01-05 NOTE — Anesthesia Pain Management Evaluation Note (Signed)
  CRNA Pain Management Visit Note  Patient: Kristine [name], 25 y.o., female  "Hello I am a [facility] of the [facility]. We have an anesthesia team available at all times to provide care throughout the hospital, including epidural management and anesthesia for C-section. I don't know your plan for the delivery whether it a natural birth, water birth, IV sedation, nitrous supplementation, doula or epidural, but we want to meet your pain goals."   1.Was your pain managed to your expectations on prior hospitalizations?   No prior hospitalizations  2.What is your expectation for pain management during this hospitalization?     Epidural  3.How can we help you reach that goal? Epidural   Record the patient's initial score and the patient's pain goal.   Pain: 1  Pain Goal: 6 [facility] wants you to be able to say your pain was always managed very well.  [name] 01/05/2016

## 2016-01-06 ENCOUNTER — Encounter (HOSPITAL_COMMUNITY)
Admission: AD | Disposition: A | Discharge status: Home / Self Care | Payer: Self-pay | Source: Ambulatory Visit | Attending: Obstetrics and Gynecology

## 2016-01-06 ENCOUNTER — Inpatient Hospital Stay (HOSPITAL_COMMUNITY): Payer: BLUE CROSS/BLUE SHIELD | Admitting: Anesthesiology

## 2016-01-06 ENCOUNTER — Encounter (HOSPITAL_COMMUNITY): Payer: Self-pay | Admitting: *Deleted

## 2016-01-06 DIAGNOSIS — Z98891 History of uterine scar from previous surgery: Secondary | ICD-10-CM

## 2016-01-06 HISTORY — DX: History of uterine scar from previous surgery: Z98.891

## 2016-01-06 SURGERY — Surgical Case
Anesthesia: Epidural

## 2016-01-06 MED ORDER — SENNOSIDES-DOCUSATE SODIUM 8.6-50 MG PO TABS
2.0000 | ORAL_TABLET | ORAL | Status: DC
  Administered 2016-01-07 – 2016-01-08 (×3): 2 via ORAL
  Filled 2016-01-06 (×3): qty 2

## 2016-01-06 MED ORDER — MENTHOL 3 MG MT LOZG: 1.0000 | LOZENGE | OROMUCOSAL | Status: DC | PRN

## 2016-01-06 MED ORDER — SCOPOLAMINE 1 MG/3DAYS TD PT72
1.0000 | MEDICATED_PATCH | Freq: Once | TRANSDERMAL | Status: DC
Start: 2016-01-06 — End: 2016-01-06

## 2016-01-06 MED ORDER — OXYCODONE HCL 5 MG PO TABS: 10.0000 mg | ORAL_TABLET | ORAL | Status: DC | PRN

## 2016-01-06 MED ORDER — MORPHINE SULFATE (PF) 0.5 MG/ML IJ SOLN
INTRAMUSCULAR | Status: AC
  Filled 2016-01-06: qty 10

## 2016-01-06 MED ORDER — SCOPOLAMINE 1 MG/3DAYS TD PT72
MEDICATED_PATCH | TRANSDERMAL | Status: AC
  Filled 2016-01-06: qty 1

## 2016-01-06 MED ORDER — DIPHENHYDRAMINE HCL 25 MG PO CAPS: 25.0000 mg | ORAL_CAPSULE | Freq: Four times a day (QID) | ORAL | Status: DC | PRN

## 2016-01-06 MED ORDER — OXYTOCIN 10 UNIT/ML IJ SOLN
40.0000 [IU] | INTRAVENOUS | Status: DC | PRN
  Administered 2016-01-06: 40 [IU] via INTRAVENOUS

## 2016-01-06 MED ORDER — MORPHINE SULFATE (PF) 0.5 MG/ML IJ SOLN
INTRAMUSCULAR | Status: DC | PRN
  Administered 2016-01-06: 4 mg via EPIDURAL

## 2016-01-06 MED ORDER — DIPHENHYDRAMINE HCL 25 MG PO CAPS
25.0000 mg | ORAL_CAPSULE | ORAL | Status: DC | PRN
  Filled 2016-01-06: qty 1

## 2016-01-06 MED ORDER — IBUPROFEN 800 MG PO TABS
800.0000 mg | ORAL_TABLET | Freq: Three times a day (TID) | ORAL | Status: DC
  Administered 2016-01-07 – 2016-01-09 (×8): 800 mg via ORAL
  Filled 2016-01-06 (×8): qty 1

## 2016-01-06 MED ORDER — NALOXONE HCL 2 MG/2ML IJ SOSY: 1.0000 ug/kg/h | PREFILLED_SYRINGE | INTRAVENOUS | Status: DC | PRN

## 2016-01-06 MED ORDER — OXYTOCIN 10 UNIT/ML IJ SOLN
INTRAMUSCULAR | Status: AC
  Filled 2016-01-06: qty 4

## 2016-01-06 MED ORDER — DIBUCAINE 1 % RE OINT: 1.0000 "application " | TOPICAL_OINTMENT | RECTAL | Status: DC | PRN

## 2016-01-06 MED ORDER — KETOROLAC TROMETHAMINE 30 MG/ML IJ SOLN: 30.0000 mg | Freq: Four times a day (QID) | INTRAMUSCULAR | Status: DC | PRN

## 2016-01-06 MED ORDER — KETOROLAC TROMETHAMINE 30 MG/ML IJ SOLN: 30.0000 mg | Freq: Four times a day (QID) | INTRAMUSCULAR | Status: AC | PRN

## 2016-01-06 MED ORDER — LIDOCAINE HCL (PF) 1 % IJ SOLN
INTRAMUSCULAR | Status: DC | PRN
  Administered 2016-01-06 (×2): 5 mL via EPIDURAL

## 2016-01-06 MED ORDER — MEPERIDINE HCL 25 MG/ML IJ SOLN
INTRAMUSCULAR | Status: DC | PRN
  Administered 2016-01-06: 12.5 mg via INTRAVENOUS

## 2016-01-06 MED ORDER — GENTAMICIN SULFATE 40 MG/ML IJ SOLN
120.0000 mg | Freq: Three times a day (TID) | INTRAMUSCULAR | Status: DC
  Administered 2016-01-06: 120 mg via INTRAVENOUS
  Filled 2016-01-06 (×2): qty 3

## 2016-01-06 MED ORDER — MEPERIDINE HCL 25 MG/ML IJ SOLN: 6.2500 mg | INTRAMUSCULAR | Status: DC | PRN

## 2016-01-06 MED ORDER — IBUPROFEN 600 MG PO TABS: 600.0000 mg | ORAL_TABLET | Freq: Four times a day (QID) | ORAL | Status: DC | PRN

## 2016-01-06 MED ORDER — NALBUPHINE HCL 10 MG/ML IJ SOLN: 5.0000 mg | Freq: Once | INTRAMUSCULAR | Status: DC | PRN

## 2016-01-06 MED ORDER — ONDANSETRON HCL 4 MG/2ML IJ SOLN
INTRAMUSCULAR | Status: AC
  Filled 2016-01-06: qty 2

## 2016-01-06 MED ORDER — ONDANSETRON HCL 4 MG/2ML IJ SOLN: 4.0000 mg | Freq: Three times a day (TID) | INTRAMUSCULAR | Status: DC | PRN

## 2016-01-06 MED ORDER — OXYCODONE HCL 5 MG PO TABS
5.0000 mg | ORAL_TABLET | ORAL | Status: DC | PRN
  Administered 2016-01-07 – 2016-01-08 (×3): 5 mg via ORAL
  Filled 2016-01-06 (×3): qty 1

## 2016-01-06 MED ORDER — CLINDAMYCIN PHOSPHATE 900 MG/50ML IV SOLN
900.0000 mg | Freq: Once | INTRAVENOUS | Status: AC
  Administered 2016-01-06: 900 mg via INTRAVENOUS
  Filled 2016-01-06: qty 50

## 2016-01-06 MED ORDER — ONDANSETRON HCL 4 MG/2ML IJ SOLN
INTRAMUSCULAR | Status: DC | PRN
  Administered 2016-01-06: 4 mg via INTRAVENOUS

## 2016-01-06 MED ORDER — SCOPOLAMINE 1 MG/3DAYS TD PT72: 1.0000 | MEDICATED_PATCH | Freq: Once | TRANSDERMAL | Status: DC

## 2016-01-06 MED ORDER — DIPHENHYDRAMINE HCL 25 MG PO CAPS: 25.0000 mg | ORAL_CAPSULE | ORAL | Status: DC | PRN

## 2016-01-06 MED ORDER — SIMETHICONE 80 MG PO CHEW
80.0000 mg | CHEWABLE_TABLET | Freq: Three times a day (TID) | ORAL | Status: DC
  Administered 2016-01-07 – 2016-01-09 (×7): 80 mg via ORAL
  Filled 2016-01-06 (×8): qty 1

## 2016-01-06 MED ORDER — FENTANYL CITRATE (PF) 100 MCG/2ML IJ SOLN: 25.0000 ug | INTRAMUSCULAR | Status: DC | PRN

## 2016-01-06 MED ORDER — SODIUM CHLORIDE 0.9% FLUSH: 3.0000 mL | INTRAVENOUS | Status: DC | PRN

## 2016-01-06 MED ORDER — PROMETHAZINE HCL 25 MG/ML IJ SOLN
6.2500 mg | INTRAMUSCULAR | Status: DC | PRN
  Administered 2016-01-06: 12.5 mg via INTRAVENOUS

## 2016-01-06 MED ORDER — SIMETHICONE 80 MG PO CHEW: 80.0000 mg | CHEWABLE_TABLET | ORAL | Status: DC | PRN

## 2016-01-06 MED ORDER — FENTANYL CITRATE (PF) 100 MCG/2ML IJ SOLN
25.0000 ug | INTRAMUSCULAR | Status: DC | PRN
Start: 2016-01-06 — End: 2016-01-06
  Administered 2016-01-06: 50 ug via INTRAVENOUS

## 2016-01-06 MED ORDER — LACTATED RINGERS IV SOLN: INTRAVENOUS | Status: DC

## 2016-01-06 MED ORDER — SODIUM CHLORIDE 0.9 % IR SOLN
Status: DC | PRN
  Administered 2016-01-06: 1

## 2016-01-06 MED ORDER — NALOXONE HCL 0.4 MG/ML IJ SOLN: 0.4000 mg | INTRAMUSCULAR | Status: DC | PRN

## 2016-01-06 MED ORDER — SODIUM CHLORIDE 0.9 % IV SOLN
2.0000 g | Freq: Four times a day (QID) | INTRAVENOUS | Status: DC
  Administered 2016-01-06: 2 g via INTRAVENOUS
  Filled 2016-01-06 (×2): qty 2000

## 2016-01-06 MED ORDER — DEXAMETHASONE SODIUM PHOSPHATE 4 MG/ML IJ SOLN
INTRAMUSCULAR | Status: DC | PRN
  Administered 2016-01-06: 4 mg via INTRAVENOUS

## 2016-01-06 MED ORDER — ZOLPIDEM TARTRATE 5 MG PO TABS: 5.0000 mg | ORAL_TABLET | Freq: Every evening | ORAL | Status: DC | PRN

## 2016-01-06 MED ORDER — TETANUS-DIPHTH-ACELL PERTUSSIS 5-2.5-18.5 LF-MCG/0.5 IM SUSP: 0.5000 mL | Freq: Once | INTRAMUSCULAR | Status: DC

## 2016-01-06 MED ORDER — WITCH HAZEL-GLYCERIN EX PADS: 1.0000 "application " | MEDICATED_PAD | CUTANEOUS | Status: DC | PRN

## 2016-01-06 MED ORDER — SODIUM CHLORIDE 0.9 % IV SOLN
10000.0000 ug | INTRAVENOUS | Status: DC | PRN
  Administered 2016-01-06: 80 ug via INTRAVENOUS

## 2016-01-06 MED ORDER — NALBUPHINE HCL 10 MG/ML IJ SOLN: 5.0000 mg | INTRAMUSCULAR | Status: DC | PRN

## 2016-01-06 MED ORDER — DEXAMETHASONE SODIUM PHOSPHATE 4 MG/ML IJ SOLN
INTRAMUSCULAR | Status: AC
  Filled 2016-01-06: qty 1

## 2016-01-06 MED ORDER — FENTANYL CITRATE (PF) 100 MCG/2ML IJ SOLN
INTRAMUSCULAR | Status: AC
  Filled 2016-01-06: qty 2

## 2016-01-06 MED ORDER — LACTATED RINGERS IV SOLN
INTRAVENOUS | Status: DC | PRN
  Administered 2016-01-06 (×3): via INTRAVENOUS

## 2016-01-06 MED ORDER — DIPHENHYDRAMINE HCL 50 MG/ML IJ SOLN: 12.5000 mg | INTRAMUSCULAR | Status: DC | PRN

## 2016-01-06 MED ORDER — OXYTOCIN 40 UNITS IN LACTATED RINGERS INFUSION - SIMPLE MED: 2.5000 [IU]/h | INTRAVENOUS | Status: AC

## 2016-01-06 MED ORDER — LACTATED RINGERS IV SOLN
INTRAVENOUS | Status: DC
Start: 2016-01-06 — End: 2016-01-09
  Administered 2016-01-07: 05:00:00 via INTRAVENOUS

## 2016-01-06 MED ORDER — PRENATAL MULTIVITAMIN CH
1.0000 | ORAL_TABLET | Freq: Every day | ORAL | Status: DC
  Administered 2016-01-07 – 2016-01-09 (×3): 1 via ORAL
  Filled 2016-01-06 (×3): qty 1

## 2016-01-06 MED ORDER — ACETAMINOPHEN 10 MG/ML IV SOLN
1000.0000 mg | Freq: Once | INTRAVENOUS | Status: DC
  Filled 2016-01-06: qty 100

## 2016-01-06 MED ORDER — MEPERIDINE HCL 25 MG/ML IJ SOLN
INTRAMUSCULAR | Status: AC
  Filled 2016-01-06: qty 1

## 2016-01-06 MED ORDER — LIDOCAINE-EPINEPHRINE (PF) 2 %-1:200000 IJ SOLN
INTRAMUSCULAR | Status: DC | PRN
  Administered 2016-01-06 (×2): 5 mL via EPIDURAL

## 2016-01-06 MED ORDER — COCONUT OIL OIL: 1.0000 "application " | TOPICAL_OIL | Status: DC | PRN

## 2016-01-06 MED ORDER — ACETAMINOPHEN 325 MG PO TABS
650.0000 mg | ORAL_TABLET | ORAL | Status: DC | PRN
  Administered 2016-01-07: 650 mg via ORAL
  Filled 2016-01-06: qty 2

## 2016-01-06 MED ORDER — SIMETHICONE 80 MG PO CHEW
80.0000 mg | CHEWABLE_TABLET | ORAL | Status: DC
  Administered 2016-01-07 – 2016-01-08 (×3): 80 mg via ORAL
  Filled 2016-01-06 (×3): qty 1

## 2016-01-06 MED ORDER — SCOPOLAMINE 1 MG/3DAYS TD PT72
MEDICATED_PATCH | TRANSDERMAL | Status: DC | PRN
  Administered 2016-01-06: 1 via TRANSDERMAL

## 2016-01-06 MED ORDER — PROMETHAZINE HCL 25 MG/ML IJ SOLN
INTRAMUSCULAR | Status: AC
  Filled 2016-01-06: qty 1

## 2016-01-06 SURGICAL SUPPLY — 33 items
BENZOIN TINCTURE PRP APPL 2/3 (GAUZE/BANDAGES/DRESSINGS) ×2 IMPLANT
CLAMP CORD UMBIL (MISCELLANEOUS) IMPLANT
CLOTH BEACON ORANGE TIMEOUT ST (SAFETY) ×2 IMPLANT
CONTAINER PREFILL 10% NBF 15ML (MISCELLANEOUS) IMPLANT
DRSG OPSITE POSTOP 4X10 (GAUZE/BANDAGES/DRESSINGS) ×2 IMPLANT
DURAPREP 26ML APPLICATOR (WOUND CARE) ×2 IMPLANT
ELECT REM PT RETURN 9FT ADLT (ELECTROSURGICAL) ×2
ELECTRODE REM PT RTRN 9FT ADLT (ELECTROSURGICAL) ×1 IMPLANT
EXTRACTOR VACUUM M CUP 4 TUBE (SUCTIONS) IMPLANT
GLOVE BIO SURGEON STRL SZ 6.5 (GLOVE) ×2 IMPLANT
GLOVE BIOGEL PI IND STRL 7.0 (GLOVE) ×1 IMPLANT
GLOVE BIOGEL PI INDICATOR 7.0 (GLOVE) ×1
GOWN STRL REUS W/TWL LRG LVL3 (GOWN DISPOSABLE) ×4 IMPLANT
KIT ABG SYR 3ML LUER SLIP (SYRINGE) IMPLANT
NEEDLE HYPO 25X5/8 SAFETYGLIDE (NEEDLE) IMPLANT
NS IRRIG 1000ML POUR BTL (IV SOLUTION) ×2 IMPLANT
PACK C SECTION WH (CUSTOM PROCEDURE TRAY) ×2 IMPLANT
PAD OB MATERNITY 4.3X12.25 (PERSONAL CARE ITEMS) ×2 IMPLANT
PENCIL SMOKE EVAC W/HOLSTER (ELECTROSURGICAL) ×2 IMPLANT
RTRCTR C-SECT PINK 25CM LRG (MISCELLANEOUS) ×2 IMPLANT
STRIP CLOSURE SKIN 1/2X4 (GAUZE/BANDAGES/DRESSINGS) ×2 IMPLANT
SUT MNCRL 0 VIOLET CTX 36 (SUTURE) ×2 IMPLANT
SUT MONOCRYL 0 CTX 36 (SUTURE) ×2
SUT PLAIN 1 NONE 54 (SUTURE) IMPLANT
SUT PLAIN 2 0 XLH (SUTURE) ×2 IMPLANT
SUT VIC AB 0 CT1 27 (SUTURE) ×2
SUT VIC AB 0 CT1 27XBRD ANBCTR (SUTURE) ×2 IMPLANT
SUT VIC AB 2-0 CT1 27 (SUTURE) ×1
SUT VIC AB 2-0 CT1 TAPERPNT 27 (SUTURE) ×1 IMPLANT
SUT VIC AB 4-0 KS 27 (SUTURE) ×2 IMPLANT
SYR BULB IRRIGATION 50ML (SYRINGE) ×2 IMPLANT
TOWEL OR 17X24 6PK STRL BLUE (TOWEL DISPOSABLE) ×2 IMPLANT
TRAY FOLEY CATH SILVER 14FR (SET/KITS/TRAYS/PACK) ×2 IMPLANT

## 2016-01-06 NOTE — Progress Notes (Signed)
Patient ID: Kristine [name], female   DOB: 1991/04/14, 25 y.o.   MRN: [id number] Pt admitted and started on Pitocin at about 2130pm after US confirmed vertex. Got increasingly uncomfortable and received epidural at 0130am FHR with good variability entire night, had an episode of variable decelerations and late decelerations at  345am so Pitocin stopped (was at 10 mu) and then restarted at 430am at 2 mu  Pt cervix at 330am was 70/4cm and FHR now with good variability and mild variable decels with contractions intermittently Will recheck cervix and if progress has slowed, start to increase pitocin again and probably place IUPC.

## 2016-01-06 NOTE — Progress Notes (Signed)
Patient ID: Kristine [name], female   DOB: 12-24-1990, 25 y.o.   MRN: [id number]  Some pressure, comfortable with ctx  Tm = 100.7 VSS gen NAD FHTs 140-150's mod var, repetitive lates  Toco Q 2-474min  D/W pt r/b/a of LTCS inc bleeding, infection and damage to surrounding organs.  Q's answered.  Will proceed when OR ready.

## 2016-01-06 NOTE — Progress Notes (Signed)
Dr. Senaida Oresichardson called on check on pt-  Updated on UC's- Pit restarted and variables. Orders to keep Pitocin at 2mu and recheck at [facility]6am. Call if needed.

## 2016-01-06 NOTE — Transfer of Care (Signed)
Immediate Anesthesia Transfer of Care Note  Patient: Kristine [name]  Procedure(s) Performed: Procedure(s): CESAREAN SECTION (N/A)  Patient Location: PACU  Anesthesia Type:Epidural  Level of Consciousness: awake, alert  and oriented  Airway & Oxygen Therapy: Patient Spontanous Breathing  Post-op Assessment: Report given to RN and Post -op Vital signs reviewed and stable  Post vital signs: Reviewed and stable  Last Vitals:  Filed Vitals:   01/06/16 2045 01/06/16 2150  BP: 131/75 121/64  Pulse: 91 82  Temp: 37.3 C 36.9 C  Resp: 20 20    Last Pain:  Filed Vitals:   01/06/16 2218  PainSc: 0-No pain      Patients Stated Pain Goal: 2 (01/06/16 0100)  Complications: No apparent anesthesia complications

## 2016-01-06 NOTE — Anesthesia Postprocedure Evaluation (Signed)
Anesthesia Post Note  Patient: Kristine [name]  Procedure(s) Performed: Procedure(s) (LRB): CESAREAN SECTION (N/A)  Patient location during evaluation: PACU Anesthesia Type: Epidural Level of consciousness: oriented and awake and alert Pain management: pain level controlled Vital Signs Assessment: post-procedure vital signs reviewed and stable Respiratory status: spontaneous breathing, respiratory function stable and patient connected to nasal cannula oxygen Cardiovascular status: blood pressure returned to baseline and stable Postop Assessment: no headache, no backache and epidural receding Anesthetic complications: no     Last Vitals:  Filed Vitals:   01/06/16 1915 01/06/16 1934  BP: 141/71 129/79  Pulse: 94 97  Temp:  37.1 C  Resp: 23 20    Last Pain:  Filed Vitals:   01/06/16 1935  PainSc: 0-No pain   Pain Goal: Patients Stated Pain Goal: 2 (01/06/16 0100)               Shelton SilvasKevin D Shauntee Karp

## 2016-01-06 NOTE — Progress Notes (Signed)
Called MD to notify of FHR with decels and interventions used. Orders to keep off for 30 minutes- if they resolve can restart at 2- if not call MD.

## 2016-01-06 NOTE — Progress Notes (Signed)
Patient ID: Kristine [name], female   DOB: 1991-01-21, 25 y.o.   MRN: [id number]  No c/o's.  Comfortable with epidural, some nausea  AFVSS  110-120's, mod var, category 1-2 toco q 2-25min  IUPC  Placed w/o diff/comp  SVE 5-6/70-80/-1/0  Continue augmentation.

## 2016-01-06 NOTE — Progress Notes (Addendum)
Pharmacy Antibiotic Note  Kristine [name] is a 25 y.o. female admitted on 01/05/2016 for SROM.  Patient now has a fever during augmentation of labor.   Pharmacy has been consulted for gentamicin dosing.  Plan: Gentamicin 120 mg IV Q8h Gentamicin target peak 6-8; Gentamicin target trough <1 SCr if treatment continues postpartum   Height: 5\' 1"  (154.9 cm) Weight: 214 lb (97.07 kg) IBW/kg (Calculated) : 47.8 kg Adjusted BW  63 kg  Temp (24hrs), Avg:98.6 F (37 C), Min:97.9 F (36.6 C), Max:100.7 F (38.2 C)   Recent Labs Lab 01/05/16 1940  WBC 10.3    CrCl cannot be calculated (Patient has no serum creatinine result on file.).    No Known Allergies  Antimicrobials this admission: Ampicillin 01/06/16 >> Gentamicin 01/06/16 >>  Thank you for allowing pharmacy to be a part of this patient's care.  Natasha BenceCline, Maridee Slape 01/06/2016 3:32 PM

## 2016-01-06 NOTE — Anesthesia Preprocedure Evaluation (Addendum)
Anesthesia Evaluation  Patient identified by MRN, date of birth, ID band Patient awake    Reviewed: Allergy & Precautions, NPO status , Patient's Chart, lab work & pertinent test results  Airway Mallampati: II  TM Distance: >3 FB Neck ROM: Full    Dental no notable dental hx. (+) Dental Advisory Given   Pulmonary neg pulmonary ROS,    Pulmonary exam normal        Cardiovascular negative cardio ROS Normal cardiovascular exam     Neuro/Psych negative neurological ROS  negative psych ROS   GI/Hepatic negative GI ROS, Neg liver ROS,   Endo/Other  negative endocrine ROS  Renal/GU negative Renal ROS  negative genitourinary   Musculoskeletal negative musculoskeletal ROS (+)   Abdominal   Peds negative pediatric ROS (+)  Hematology  (+) anemia ,   Anesthesia Other Findings   Reproductive/Obstetrics (+) Pregnancy                             Anesthesia Physical Anesthesia Plan  ASA: II and emergent  Anesthesia Plan: Epidural   Post-op Pain Management:    Induction:   Airway Management Planned:   Additional Equipment:   Intra-op Plan:   Post-operative Plan:   Informed Consent: I have reviewed the patients History and Physical, chart, labs and discussed the procedure including the risks, benefits and alternatives for the proposed anesthesia with the patient or authorized representative who has indicated his/her understanding and acceptance.   Dental advisory given  Plan Discussed with: Anesthesiologist, CRNA and Surgeon  Anesthesia Plan Comments: (Patient for C/Section for fetal intolerance to labor. Will use epidural for C/Section. M. Marbella Markgraf,MD)       Anesthesia Quick Evaluation

## 2016-01-06 NOTE — Anesthesia Procedure Notes (Signed)
Epidural Patient location during procedure: OB Start time: 01/06/2016 1:30 AM End time: 01/06/2016 1:47 AM  Staffing Anesthesiologist: Heather RobertsSINGER, [place] Performed by: anesthesiologist   Preanesthetic Checklist Completed: patient identified, site marked, pre-op evaluation, timeout performed, IV checked, risks and benefits discussed and monitors and equipment checked  Epidural Patient position: sitting Prep: DuraPrep Patient monitoring: heart rate, cardiac monitor, continuous pulse ox and blood pressure Approach: midline Location: L2-L3 Injection technique: LOR saline  Needle:  Needle type: Tuohy  Needle gauge: 17 G Needle length: 9 cm Needle insertion depth: 7 cm Catheter size: 20 Guage Catheter at skin depth: 12 cm Test dose: negative and Other  Assessment Events: blood not aspirated, injection not painful, no injection resistance and negative IV test  Additional Notes Informed consent obtained prior to proceeding including risk of failure, 1% risk of PDPH, risk of minor discomfort and bruising.  Discussed rare but serious complications including epidural abscess, permanent nerve injury, epidural hematoma.  Discussed alternatives to epidural analgesia and patient desires to proceed.  Timeout performed pre-procedure verifying patient name, procedure, and platelet count.  Patient tolerated procedure well.

## 2016-01-06 NOTE — Progress Notes (Signed)
Patient ID: Kristine [name], female   DOB: [birth year], 25 y.o.   MRN: [id number]  Comfortable with epidural  AFVSS gen NAD FHTs 110-120's, mod-good var, category 1-2 toco Q 2-3 min  SVE 7/90/0-+1  Continue current mgmt Expect SVD

## 2016-01-07 LAB — CBC
HCT: 31.5 % — ABNORMAL LOW (ref 36.0–46.0)
Hemoglobin: 11 g/dL — ABNORMAL LOW (ref 12.0–15.0)
MCH: 33.2 pg (ref 26.0–34.0)
MCHC: 34.9 g/dL (ref 30.0–36.0)
MCV: 95.2 fL (ref 78.0–100.0)
PLATELETS: 141 10*3/uL — AB (ref 150–400)
RBC: 3.31 MIL/uL — ABNORMAL LOW (ref 3.87–5.11)
RDW: 12.9 % (ref 11.5–15.5)
WBC: 24.3 10*3/uL — AB (ref 4.0–10.5)

## 2016-01-07 LAB — RPR: RPR Ser Ql: NONREACTIVE

## 2016-01-07 MED ORDER — DEXTROSE 5 % IV SOLN
1.0000 g | Freq: Two times a day (BID) | INTRAVENOUS | Status: AC
  Administered 2016-01-07 (×2): 1 g via INTRAVENOUS
  Filled 2016-01-07 (×2): qty 1

## 2016-01-07 MED ORDER — RHO D IMMUNE GLOBULIN 1500 UNIT/2ML IJ SOSY
300.0000 ug | PREFILLED_SYRINGE | Freq: Once | INTRAMUSCULAR | Status: AC
  Administered 2016-01-07: 300 ug via INTRAVENOUS
  Filled 2016-01-07: qty 2

## 2016-01-07 NOTE — Anesthesia Postprocedure Evaluation (Signed)
Anesthesia Post Note  Patient: Kristine [name]  Procedure(s) Performed: Procedure(s) (LRB): CESAREAN SECTION (N/A)  Patient location during evaluation: Mother Baby Anesthesia Type: Epidural Level of consciousness: awake and alert Pain management: pain level controlled Vital Signs Assessment: post-procedure vital signs reviewed and stable Respiratory status: spontaneous breathing, nonlabored ventilation and respiratory function stable Cardiovascular status: stable Postop Assessment: no headache, no backache and epidural receding Anesthetic complications: no     Last Vitals:  Filed Vitals:   01/07/16 0026 01/07/16 0435  BP: 112/61 107/49  Pulse: 100 76  Temp: 37.3 C 37.1 C  Resp: 20 18    Last Pain:  Filed Vitals:   01/07/16 0500  PainSc: 5    Pain Goal: Patients Stated Pain Goal: 2 (01/07/16 0435)               Junious SilkGILBERT,Navi Ewton

## 2016-01-07 NOTE — Addendum Note (Signed)
Addendum  created 01/07/16 0753 by Junious SilkMelinda Whitaker Holderman, CRNA   Modules edited: Charges VN, Clinical Notes   Clinical Notes:  File: [id number]

## 2016-01-07 NOTE — Progress Notes (Signed)
Subjective: Postpartum Day 1: Cesarean Delivery Patient reports tolerating PO and + flatus.  Denies any fever or chills. No Ha or CP. NO complaints. Doing ell. Breastfeeding - bonding well with baby girl Kristine [name]  Objective: Vital signs in last 24 hours: Temp:  [98.2 F (36.8 C)-100.7 F (38.2 C)] 98.2 F (36.8 C) (06/13 0855) Pulse Rate:  [67-119] 67 (06/13 0855) Resp:  [13-23] 18 (06/13 0855) BP: (102-141)/(49-85) 112/70 mmHg (06/13 0855) SpO2:  [95 %-100 %] 99 % (06/13 0855)  Physical Exam:  General: alert, cooperative and no distress Lochia: appropriate Uterine Fundus: firm Incision: dressing c/d/i DVT Evaluation: No evidence of DVT seen on physical exam.   Recent Labs  01/05/16 1940 01/07/16 0504  HGB 12.6 11.0*  HCT 35.4* 31.5*    Assessment/Plan: Status post Cesarean section. Doing well postoperatively.  Continue on antibx for 24hrs then d/c.  Kristine [name] 01/07/2016, 9:52 AM

## 2016-01-07 NOTE — Lactation Note (Signed)
This note was copied from a baby's chart. Lactation Consultation Note  Patient Name: Kristine [name] Reason for consult: Initial assessment - 39 1/[redacted] weeks GA, 6-10.4 oz 1% weight loss Baby is 18 hours and has been to the breast several times since birth 8518 - 10 -15 - 10 mins feeds.  Latch score 8. Voids and stools Qs for age.  Baby already latched in laid back position when LC walked in with hat and fully clothed. Per mom the baby  Had been feeding for 10 mins, and LC noted the baby to be non- nutritive and loosing depth @ the breast. LC assisted  Instructed mom on how to release baby from the breast. Nipple well rounded. LC undressed baby and changed a large wet diaper.  Placed baby skin to skin and showed mom how to tickle the upper lip until the baby opened wide for a deep latch . Lips flanged  And mom commented how much better it felt . Multiply swallows noted and after 6 minutes baby released on her own. Nipple well rounded.  Baby skin to skin on moms chest sleeping after feeding. Prior to latch - showed mom how to hand expressed and several large drops noted and baby  Latched right after wards.  Per mom active with [facility] [place] , and attended the breast feeding class during her pregnancy. Also has a DEBP Medela at home.  Mother informed of post-discharge support and given phone number to the lactation department, including services for phone call assistance; out-patient appointments; and breastfeeding support group. List of other breastfeeding resources in the community given in the handout. Encouraged mother to call for problems or concerns related to breastfeeding.  Maternal Data Has patient been taught Hand Expression?: Yes  Feeding Feeding Type: Breast Fed Length of feed: 6 min (swallows noted )  LATCH Score/Interventions Latch: Grasps breast easily, tongue down, lips flanged, rhythmical sucking.  Audible Swallowing: Spontaneous and intermittent  Type of  Nipple: Everted at rest and after stimulation  Comfort (Breast/Nipple): Soft / non-tender     Hold (Positioning): Assistance needed to correctly position infant at breast and maintain latch. Intervention(s): Breastfeeding basics reviewed;Support Pillows;Position options;Skin to skin  LATCH Score: 9  Lactation Tools Discussed/Used WIC Program: Yes   Consult Status Consult Status: Follow-up Date: 01/08/16 Follow-up type: In-patient    Kristine [name], Kristine [name] [name], 11:35 AM

## 2016-01-08 LAB — RH IG WORKUP (INCLUDES ABO/RH)
ABO/RH(D): A NEG
Fetal Screen: NEGATIVE
Gestational Age(Wks): 39.1
Unit division: 0

## 2016-01-08 NOTE — Lactation Note (Signed)
This note was copied from a baby's chart. Lactation Consultation Note; Mom has the baby latched to breast when I went into room. Reports breasts are feeling a little fuller today.  Reports baby cluster fed a lot through the night. Reports some pain with initial latch that then eases off after a few minutes. Mom has Medela PIS for home use. Reviewed engorgement prevention and treatment. No questions at present. To call for assist prn   Patient Name: Kristine Verdia Kubaricka Danzy RUEAV'WToday's Date: 01/08/2016 Reason for consult: Follow-up assessment   Maternal Data    Feeding Feeding Type: Breast Fed Length of feed: 15 min  LATCH Score/Interventions Latch: Grasps breast easily, tongue down, lips flanged, rhythmical sucking.  Audible Swallowing: A few with stimulation  Type of Nipple: Everted at rest and after stimulation  Comfort (Breast/Nipple): Soft / non-tender     Hold (Positioning): No assistance needed to correctly position infant at breast.  LATCH Score: 9  Lactation Tools Discussed/Used     Consult Status      Pamelia HoitWeeks, Linnie Mcglocklin D 01/08/2016, 11:49 AM

## 2016-01-08 NOTE — Op Note (Signed)
NAMVerdia Kuba:  Kristine [name], Kristine [name]                ACCOUNT NO.:  [id number]  MEDICAL RECORD NO.:  [id number]  LOCATION:  9126                          FACILITY:  [facility]  PHYSICIAN:  Sherron MondayJody Bovard, MD        DATE OF BIRTH:  07-08-1991  DATE OF PROCEDURE:  01/06/2016 DATE OF DISCHARGE:                              OPERATIVE REPORT   PREOPERATIVE DIAGNOSES:  Arrest of dilatation, repetitive late decelerations, maternal fever.  POSTOPERATIVE DIAGNOSES:  Arrest of dilatation, repetitive late decelerations, maternal fever, delivered.  PROCEDURE:  Primary low transverse cesarean section.  FINDINGS:  Viable female infant at [id number] with Apgars of 9 at 1 minute, 9 at 5 minutes.  Weight of 6 pounds and 11.8 ounces.  Normal uterus, tubes and ovaries are noted.  SURGEON:  Sherron MondayJody Bovard, M.D.  ANESTHESIA:  Epidural.  ESTIMATED BLOOD LOSS:  Approximately 600 mL.  COMPLICATIONS:  None.  DESCRIPTION OF PROCEDURE:  After informed consent was reviewed with the patient and her family, she was transported to the operating room, where her epidural was dosed.  She was placed on the table in supine position with a leftward tilt.  When the anesthesia was noted to be adequate, she was prepped and draped in a normal sterile fashion.  A Pfannenstiel skin incision was made at the level approximately 2 fingerbreadths above her pubic symphysis and carried through to the underlying layer of fascia sharply.  The fascia was incised in the midline.  The incision was extended laterally with Mayo scissors.  The superior aspect of the fascial incision was grasped with Kocher clamps and elevated and the rectus muscles were dissected off both bluntly and sharply.  Midline was easily identified and the peritoneum was entered bluntly.  Alexis skin retractor was carefully placed making sure that no bowel was entrapped.  The uterus was incised in a transverse fashion after obtaining foot.  The infant was delivered from a vertex  presentation.  Nose and mouth were suctioned on the field.  The infant was dried and held for a minute on the abdomen  before the cord was clamped.  Placenta was expressed.  Clot and debris were cleared from the uterus.  The uterine incision was closed in 2 layers with 0 Monocryl, the first of which is running locked and the second as an imbricating layer.  The gutters were cleared of all clot and debris.  The retractor was removed. The peritoneum was reapproximated with 2-0 Vicryl.  The fascia was reapproximated with 0 Vicryl after the subfascial planes were inspected and found to be hemostatic.  The fascia was closed in a single suture. Subcuticular adipose layer was made hemostatic with Bovie cautery.  The dead space was closed with a plain gut.  The skin was closed with 4-0 Vicryl on a Keith needle. Benzoin and Steri-Strips were applied.  The patient tolerated the procedure well.  Sponge, lap, and needle count was correct x2 at the end of the procedure.     Sherron MondayJody Bovard, MD     JB/MEDQ  D:  01/08/2016  T:  01/08/2016  Job:  [id number]

## 2016-01-08 NOTE — Progress Notes (Signed)
POD #2 Doing ok Afeb, VSS Abd- soft, fundus firm, incision intact Continue routine care 

## 2016-01-08 NOTE — Brief Op Note (Signed)
01/05/2016 - 01/06/2016  8:11 AM  PATIENT:  Kristine KubaEricka [name]  25 y.o. female  PRE-OPERATIVE DIAGNOSIS:  arrest of dilation and repetitive late decelerations, maternal fever   POST-OPERATIVE DIAGNOSIS:  arrest of dilation and repetitive late decelerations, maternal fever   PROCEDURE:  Procedure(s): CESAREAN SECTION (N/A)  FINDINGS: viable female infant at 17:19, apgars 9/9, wt  6#11.8, nl uterus, tubes, ovaries.    SURGEON:  Surgeon(s) and Role:    * Sherian ReinJody Bovard-Stuckert, MD - Primary  ANESTHESIA:   epidural  EBL:   600cc  BLOOD ADMINISTERED:none  DRAINS: Urinary Catheter (Foley)   LOCAL MEDICATIONS USED:  NONE  SPECIMEN:  Source of Specimen:  Placenta  DISPOSITION OF SPECIMEN:  PATHOLOGY  COUNTS:  YES  TOURNIQUET:  * No tourniquets in log *  DICTATION: .Other Dictation: Dictation Number [id number]  PLAN OF CARE: Admit to inpatient   PATIENT DISPOSITION:  PACU - hemodynamically stable.   Delay start of Pharmacological VTE agent (>24hrs) due to surgical blood loss or risk of bleeding: yes

## 2016-01-09 ENCOUNTER — Encounter: Payer: Self-pay | Admitting: Obstetrics and Gynecology

## 2016-01-09 LAB — TYPE AND SCREEN
ABO/RH(D): A NEG
ANTIBODY SCREEN: POSITIVE
DAT, IgG: NEGATIVE
Unit division: 0
Unit division: 0

## 2016-01-09 MED ORDER — OXYCODONE HCL 5 MG PO TABS: 5.0000 mg | ORAL_TABLET | ORAL | Status: DC | PRN

## 2016-01-09 MED ORDER — IBUPROFEN 800 MG PO TABS: 800.0000 mg | ORAL_TABLET | Freq: Three times a day (TID) | ORAL | Status: DC

## 2016-01-09 NOTE — Discharge Summary (Addendum)
OB Discharge Summary     Patient Name: Kristine [name] DOB: [birth year] MRN: [id number]  Date of admission: 01/05/2016 Delivering MD: Sherian Rein   Date of discharge: 01/09/2016  Admitting diagnosis: 39 WKS, WATER BROKE Intrauterine pregnancy: [redacted]w[redacted]d     Secondary diagnosis:  Principal Problem:   S/P cesarean section Active Problems:   PROM (premature rupture of membranes)       Discharge diagnosis: Term Pregnancy Delivered                                                                                                Post partum procedures:none  Augmentation: Pitocin  Complications: None  Hospital course:  Onset of Labor With Unplanned C/S  25 y.o. yo G1P1001 at [redacted]w[redacted]d was admitted in Latent Labor on 01/05/2016. Patient had a labor course complicated by relative arrest of dilation and category 3 tracing. . Membrane Rupture Time/Date: 5:00 PM ,01/05/2016   The patient went for cesarean section due to Category 3 tracing, and delivered a Viable infant,01/06/2016  Details of operation can be found in separate operative note. Patient had an uncomplicated postpartum course.  She is ambulating,tolerating a regular diet, passing flatus, and urinating well.  Patient is discharged home in stable condition 01/09/2016.  Physical exam  Filed Vitals:   01/07/16 1656 01/08/16 0650 01/08/16 1807 01/09/16 0602  BP: 111/50 109/67 131/76 106/61  Pulse: 90 67 76 63  Temp: 98.3 F (36.8 C) 98.4 F (36.9 C) 98.2 F (36.8 C) 98.4 F (36.9 C)  TempSrc: Oral Oral Oral Oral  Resp: Height:      Weight:      SpO2: 100%      General: alert and cooperative Lochia: appropriate Uterine Fundus: firm Incision: C/D/I   Labs: Lab Results  Component Value Date   WBC 24.3* 01/07/2016   HGB 11.0* 01/07/2016   HCT 31.5* 01/07/2016   MCV 95.2 01/07/2016   PLT 141* 01/07/2016   CMP Latest Ref Rng 02/11/2013  Glucose 70 - 99 mg/dL 78  BUN 6 - 23 mg/dL 8  Creatinine 4.09 - 8.11  mg/dL 9.14  Sodium 782 - 956 mEq/L 139  Potassium 3.5 - 5.3 mEq/L 4.0  Chloride 96 - 112 mEq/L 105  CO2 19 - 32 mEq/L 22  Calcium 8.4 - 10.5 mg/dL 9.0  Total Protein 6.0 - 8.3 g/dL 6.9  Total Bilirubin 0.3 - 1.2 mg/dL 0.4  Alkaline Phos 39 - 117 U/L 47  AST 0 - 37 U/L 12  ALT 0 - 35 U/L <8    Discharge instruction: per After Visit Summary and "Baby and Me Booklet".  After visit meds:    Medication List    STOP taking these medications        promethazine 25 MG tablet  Commonly known as:  PHENERGAN      TAKE these medications        ibuprofen 800 MG tablet  Commonly known as:  ADVIL,MOTRIN  Take 1 tablet (800 mg total) by mouth every 8 (eight) hours.     multivitamin-prenatal 27-0.8 MG Tabs  tablet  Take 1 tablet by mouth daily at 12 noon.     oxyCODONE 5 MG immediate release tablet  Commonly known as:  Oxy IR/ROXICODONE  Take 1 tablet (5 mg total) by mouth every 4 (four) hours as needed (pain scale 4-7).        Diet: routine diet  Activity: Advance as tolerated. Pelvic rest for 6 weeks.   Outpatient follow up:2 weeks Follow up Appt: Future Appointments Date Time Provider Department Center  05/04/2016 1:45 PM Ria CommentPatricia Grubb, FNP [facility] None   Follow up Visit:No Follow-up on file.  Postpartum contraception: Undecided  Newborn Data: Live born female  Birth Weight: 6 lb 11.8 oz (3055 g) APGAR: 9, 9  Baby Feeding: Breast Disposition:home with mother   01/09/2016 Oliver PilaICHARDSON,Latandra Loureiro W, MD

## 2016-01-09 NOTE — Progress Notes (Signed)
Subjective: Postpartum Day 3: Cesarean Delivery Patient reports tolerating PO and no problems voiding.    Objective: Vital signs in last 24 hours: Temp:  [98.2 F (36.8 C)-98.4 F (36.9 C)] 98.4 F (36.9 C) (06/15 0602) Pulse Rate:  [63-76] 63 (06/15 0602) Resp:  [16-18] 16 (06/15 0602) BP: (106-131)/(61-76) 106/61 mmHg (06/15 0602)  Physical Exam:  General: alert and cooperative Lochia: appropriate Uterine Fundus: firm Incision: C/D/I    Recent Labs  01/07/16 0504  HGB 11.0*  HCT 31.5*    Assessment/Plan: Status post Cesarean section. Doing well postoperatively.  Discharge home with standard precautions and return to clinic in 2 weeks.  Oliver PilaRICHARDSON,Larcenia Holaday W 01/09/2016, 7:58 AM

## 2016-01-09 NOTE — Lactation Note (Signed)
This note was copied from a baby's chart. Lactation Consultation Note  Patient Name: Kristine [name] ZOXWR'UToday's Date: 01/09/2016 Reason for consult: Follow-up assessment  Visited with Mom on day of discharge, baby 1064 hrs old.  Mom states baby has been breastfeeding well, and breasts are filling.  Watched Mom latch baby in football hold and baby latches well, with rhythmic sucking and swallowing heard.  Basics reviewed with importance of frequent feedings and placing baby skin to skin during feedings.  Encouraged to feed >8 times in 24 hrs.  Engorgement prevention and treatment discussed.  Reminded her of OP lactation services available and encouraged her to call prn.  Consult Status Consult Status: Complete Date: 01/09/16 Follow-up type: Call as needed    Judee ClaraSmith, Carlton Sweaney E 01/09/2016, 9:51 AM

## 2016-02-17 DIAGNOSIS — Z1389 Encounter for screening for other disorder: Secondary | ICD-10-CM | POA: Diagnosis not present

## 2016-02-17 DIAGNOSIS — Z3202 Encounter for pregnancy test, result negative: Secondary | ICD-10-CM | POA: Diagnosis not present

## 2016-02-17 DIAGNOSIS — Z3049 Encounter for surveillance of other contraceptives: Secondary | ICD-10-CM | POA: Diagnosis not present

## 2016-02-17 DIAGNOSIS — Z3009 Encounter for other general counseling and advice on contraception: Secondary | ICD-10-CM | POA: Diagnosis not present

## 2016-04-20 DIAGNOSIS — Z3042 Encounter for surveillance of injectable contraceptive: Secondary | ICD-10-CM | POA: Diagnosis not present

## 2016-04-20 DIAGNOSIS — Z309 Encounter for contraceptive management, unspecified: Secondary | ICD-10-CM | POA: Diagnosis not present

## 2016-05-04 ENCOUNTER — Ambulatory Visit: Payer: BLUE CROSS/BLUE SHIELD | Admitting: Nurse Practitioner

## 2016-05-05 ENCOUNTER — Encounter: Payer: Self-pay | Admitting: Nurse Practitioner

## 2016-05-27 DIAGNOSIS — H04322 Acute dacryocystitis of left lacrimal passage: Secondary | ICD-10-CM | POA: Diagnosis not present

## 2016-05-30 DIAGNOSIS — J189 Pneumonia, unspecified organism: Secondary | ICD-10-CM | POA: Diagnosis not present

## 2016-05-30 DIAGNOSIS — R509 Fever, unspecified: Secondary | ICD-10-CM | POA: Diagnosis not present

## 2016-06-03 DIAGNOSIS — H04322 Acute dacryocystitis of left lacrimal passage: Secondary | ICD-10-CM | POA: Diagnosis not present

## 2016-07-13 ENCOUNTER — Ambulatory Visit: Payer: BLUE CROSS/BLUE SHIELD | Admitting: Nurse Practitioner

## 2016-08-05 ENCOUNTER — Encounter: Payer: Self-pay | Admitting: Nurse Practitioner

## 2016-08-05 ENCOUNTER — Ambulatory Visit (INDEPENDENT_AMBULATORY_CARE_PROVIDER_SITE_OTHER): Payer: BLUE CROSS/BLUE SHIELD | Admitting: Nurse Practitioner

## 2016-08-05 ENCOUNTER — Other Ambulatory Visit (INDEPENDENT_AMBULATORY_CARE_PROVIDER_SITE_OTHER): Payer: BLUE CROSS/BLUE SHIELD

## 2016-08-05 VITALS — BP 128/72 | HR 74 | Temp 98.3°F | Ht 61.0 in | Wt 174.0 lb

## 2016-08-05 DIAGNOSIS — M25531 Pain in right wrist: Secondary | ICD-10-CM | POA: Diagnosis not present

## 2016-08-05 DIAGNOSIS — N760 Acute vaginitis: Secondary | ICD-10-CM | POA: Diagnosis not present

## 2016-08-05 DIAGNOSIS — N898 Other specified noninflammatory disorders of vagina: Secondary | ICD-10-CM | POA: Diagnosis not present

## 2016-08-05 DIAGNOSIS — Z7251 High risk heterosexual behavior: Secondary | ICD-10-CM

## 2016-08-05 DIAGNOSIS — Z0001 Encounter for general adult medical examination with abnormal findings: Secondary | ICD-10-CM | POA: Diagnosis not present

## 2016-08-05 LAB — LIPID PANEL
CHOLESTEROL: 139 mg/dL (ref 0–200)
HDL: 44.9 mg/dL (ref >39.00)
LDL CALC: 86 mg/dL (ref 0–99)
NonHDL: 94.01
TRIGLYCERIDES: 39 mg/dL (ref 0.0–149.0)
Total CHOL/HDL Ratio: 3
VLDL: 7.8 mg/dL (ref 0.0–40.0)

## 2016-08-05 LAB — CBC WITH DIFFERENTIAL/PLATELET
BASOS PCT: 0.4 % (ref 0.0–3.0)
Basophils Absolute: 0 10*3/uL (ref 0.0–0.1)
EOS PCT: 1.1 % (ref 0.0–5.0)
Eosinophils Absolute: 0.1 10*3/uL (ref 0.0–0.7)
HEMATOCRIT: 35.7 % — AB (ref 36.0–46.0)
Hemoglobin: 12.4 g/dL (ref 12.0–15.0)
LYMPHS PCT: 34.2 % (ref 12.0–46.0)
Lymphs Abs: 1.8 10*3/uL (ref 0.7–4.0)
MCHC: 34.6 g/dL (ref 30.0–36.0)
MCV: 95.7 fl (ref 78.0–100.0)
MONOS PCT: 4.7 % (ref 3.0–12.0)
Monocytes Absolute: 0.2 10*3/uL (ref 0.1–1.0)
NEUTROS ABS: 3.2 10*3/uL (ref 1.4–7.7)
Neutrophils Relative %: 59.6 % (ref 43.0–77.0)
Platelets: 155 10*3/uL (ref 150.0–400.0)
RBC: 3.74 Mil/uL — ABNORMAL LOW (ref 3.87–5.11)
RDW: 13.5 % (ref 11.5–15.5)
WBC: 5.3 10*3/uL (ref 4.0–10.5)

## 2016-08-05 LAB — WET PREP, GENITAL: TRICH WET PREP: NONE SEEN — AB

## 2016-08-05 LAB — COMPREHENSIVE METABOLIC PANEL
ALBUMIN: 4.1 g/dL (ref 3.5–5.2)
ALT: 5 U/L (ref 0–35)
AST: 10 U/L (ref 0–37)
Alkaline Phosphatase: 45 U/L (ref 39–117)
BILIRUBIN TOTAL: 0.5 mg/dL (ref 0.2–1.2)
BUN: 6 mg/dL (ref 6–23)
CALCIUM: 8.9 mg/dL (ref 8.4–10.5)
CHLORIDE: 109 meq/L (ref 96–112)
CO2: 24 mEq/L (ref 19–32)
Creatinine, Ser: 0.68 mg/dL (ref 0.40–1.20)
GFR: 135.16 mL/min (ref >60.00)
Glucose, Bld: 86 mg/dL (ref 70–99)
Potassium: 4 mEq/L (ref 3.5–5.1)
Sodium: 139 mEq/L (ref 135–145)
Total Protein: 7 g/dL (ref 6.0–8.3)

## 2016-08-05 LAB — TSH: TSH: 0.81 u[IU]/mL (ref 0.35–4.50)

## 2016-08-05 LAB — HIV ANTIBODY (ROUTINE TESTING W REFLEX): HIV 1&2 Ab, 4th Generation: NONREACTIVE

## 2016-08-05 MED ORDER — FLUCONAZOLE 150 MG PO TABS: 150.0000 mg | ORAL_TABLET | Freq: Every day | ORAL | 0 refills | Status: DC

## 2016-08-05 MED ORDER — METRONIDAZOLE 0.75 % VA GEL: 1.0000 | Freq: Every day | VAGINAL | 0 refills | Status: DC

## 2016-08-05 NOTE — Progress Notes (Signed)
Subjective:    Patient ID: Fernanda Drum, female    DOB: [birth year], 26 y.o.   MRN: [id number]  Patient presents today for complete physical or establish care (new patient) and right wrist pain  Wrist Pain   The pain is present in the right wrist. This is a chronic problem. The current episode started more than 1 year ago. There has been no history of extremity trauma. The problem occurs intermittently. The problem has been waxing and waning. The quality of the pain is described as aching. Pertinent negatives include no fever, inability to bear weight, itching, joint locking, joint swelling, limited range of motion, numbness, stiffness or tingling. The symptoms are aggravated by activity. She has tried nothing for the symptoms. Family history does not include gout or rheumatoid arthritis. There is no history of diabetes, gout, osteoarthritis or rheumatoid arthritis.  Vaginal Discharge  The patient's primary symptoms include vaginal discharge. The patient's pertinent negatives include no genital itching, genital odor, genital rash, missed menses, pelvic pain or vaginal bleeding. This is a new problem. The current episode started in the past 7 days. The problem has been unchanged. The patient is experiencing no pain. Associated symptoms include joint pain. Pertinent negatives include no abdominal pain, anorexia, constipation, diarrhea, discolored urine, dysuria, fever, frequency, headaches, joint swelling, nausea, painful intercourse, rash, sore throat or urgency. The vaginal discharge was thick and white. She is sexually active. It is unknown whether or not her partner has an STD. Contraceptive use: nexplanon. Her menstrual history has been regular. There is no history of PID, an STD or vaginosis.    Immunizations: (TDAP, Hep C screen, Pneumovax, Influenza, zoster)  Health Maintenance  Topic Date Due  . Flu Shot  02/25/2016  . Pap Smear  04/17/2017  . Tetanus Vaccine  01/06/2026  . HIV Screening   Completed   Diet:regular Weight:  Wt Readings from Last 3 Encounters:  08/05/16 174 lb (78.9 kg)  01/05/16 214 lb (97.1 kg)  12/27/15 214 lb 0.6 oz (97.1 kg)   Exercise:none Fall Risk: denies No flowsheet data found. Home Safety:home with 23monthold child and boyfriend Depression/Suicide:denies No flowsheet data found. No flowsheet data found. Pap Smear (every 362yrfor >21-29 without HPV, every 5y66yror >30-65y73yrth HPV):up to date Vision:up to date Dental:up to date Advanced Directive: Advanced Directives 01/06/2016  Does Patient Have a Medical Advance Directive? No  Would patient like information on creating a medical advance directive? No - patient declined information   Sexual History (birth control, marital status, STD):sexually active, has nexplanon, no condom use.  Medications and allergies reviewed with patient and updated if appropriate.  Patient Active Problem List   Diagnosis Date Noted  . Right wrist pain 08/05/2016  . S/P cesarean section 01/06/2016  . PROM (premature rupture of membranes) 01/05/2016  . Menorrhagia with regular cycle 04/17/2014  . Acute blood loss anemia 04/17/2014    Current Outpatient Prescriptions on File Prior to Visit  Medication Sig Dispense Refill  . Prenatal Vit-Fe Fumarate-FA (MULTIVITAMIN-PRENATAL) 27-0.8 MG TABS tablet Take 1 tablet by mouth daily at 12 noon.     No current facility-administered medications on file prior to visit.     Past Medical History:  Diagnosis Date  . Anemia   . S/P cesarean section 01/06/2016  . STD (sexually transmitted disease) 03/2014   Chlamydia    Past Surgical History:  Procedure Laterality Date  . CESAREAN SECTION N/A 01/06/2016   Procedure: CESAREAN SECTION;  Surgeon: JodyJanyth Contes;  Location: [place];  Service: Obstetrics;  Laterality: N/A;  . WISDOM TOOTH EXTRACTION      Social History   Social History  . Marital status: Single    Spouse name: N/A  . Number  of children: N/A  . Years of education: N/A   Social History Main Topics  . Smoking status: Never Smoker  . Smokeless tobacco: Never Used  . Alcohol use 1.0 oz/week    2 Standard drinks or equivalent per week  . Drug use: No  . Sexual activity: Yes    Partners: Male    Birth control/ protection: Condom, Implant   Other Topics Concern  . None   Social History Narrative  . None    Family History  Problem Relation Age of Onset  . Multiple sclerosis Father   . Hypertension Mother   . Heart disease Maternal Grandmother         Review of Systems  Constitutional: Negative for fever, malaise/fatigue and weight loss.  HENT: Negative for congestion and sore throat.   Eyes:       Negative for visual changes  Respiratory: Negative for cough and shortness of breath.   Cardiovascular: Negative for chest pain, palpitations and leg swelling.  Gastrointestinal: Negative for abdominal pain, anorexia, blood in stool, constipation, diarrhea, heartburn and nausea.  Genitourinary: Positive for vaginal discharge. Negative for dysuria, frequency, missed menses, pelvic pain and urgency.  Musculoskeletal: Positive for joint pain. Negative for falls, gout, myalgias and stiffness.  Skin: Negative for itching and rash.  Neurological: Negative for dizziness, tingling, sensory change, numbness and headaches.  Endo/Heme/Allergies: Does not bruise/bleed easily.  Psychiatric/Behavioral: Negative for depression, substance abuse and suicidal ideas. The patient is not nervous/anxious.     Objective:   Vitals:   08/05/16 0943  BP: 128/72  Pulse: 74  Temp: 98.3 F (36.8 C)    Body mass index is 32.88 kg/m.   Physical Examination:  Physical Exam  Constitutional: She is oriented to person, place, and time and well-developed, well-nourished, and in no distress. No distress.  HENT:  Right Ear: External ear normal.  Left Ear: External ear normal.  Nose: Nose normal.  Mouth/Throat: Oropharynx  is clear and moist. No oropharyngeal exudate.  Eyes: Conjunctivae and EOM are normal. Pupils are equal, round, and reactive to light. No scleral icterus.  Neck: Normal range of motion. Neck supple. No thyromegaly present.  Cardiovascular: Normal rate, normal heart sounds and intact distal pulses.   Pulmonary/Chest: Effort normal and breath sounds normal. She exhibits no tenderness.  Abdominal: Soft. Bowel sounds are normal. She exhibits no distension. There is no tenderness.  Musculoskeletal: Normal range of motion. She exhibits no edema or tenderness.       Right wrist: Normal.       Right forearm: Normal.       Right hand: Normal.  Lymphadenopathy:    She has no cervical adenopathy.  Neurological: She is alert and oriented to person, place, and time. Gait normal.  Skin: Skin is warm and dry.  Psychiatric: Affect and judgment normal.    ASSESSMENT and PLAN:  Ailin was seen today for establish care.  Diagnoses and all orders for this visit:  Encounter for preventative adult health care exam with abnormal findings -     CBC w/Diff; Future -     Comp Met (CMET); Future -     TSH; Future -     Lipid panel; Future  Vaginosis -     HIV  antibody; Future -     Wet prep, genital; Future -     RPR; Future -     metroNIDAZOLE (METROGEL) 0.75 % vaginal gel; Place 1 Applicatorful vaginally at bedtime. -     fluconazole (DIFLUCAN) 150 MG tablet; Take 1 tablet (150 mg total) by mouth daily. Repeat second dose in 3days  Unprotected sexual intercourse -     HIV antibody; Future -     Wet prep, genital; Future -     RPR; Future  Right wrist pain    No problem-specific Assessment & Plan notes found for this encounter.    Recent Results (from the past 2160 hour(s))  CBC w/Diff     Status: Abnormal   Collection Time: 08/05/16 10:30 AM  Result Value Ref Range   WBC 5.3 4.0 - 10.5 K/uL   RBC 3.74 (L) 3.87 - 5.11 Mil/uL   Hemoglobin 12.4 12.0 - 15.0 g/dL   HCT 49.0 (L) 76.2 - 70.1 %     MCV 95.7 78.0 - 100.0 fl   MCHC 34.6 30.0 - 36.0 g/dL   RDW 78.4 12.7 - 17.9 %   Platelets 155.0 150.0 - 400.0 K/uL   Neutrophils Relative % 59.6 43.0 - 77.0 %   Lymphocytes Relative 34.2 12.0 - 46.0 %   Monocytes Relative 4.7 3.0 - 12.0 %   Eosinophils Relative 1.1 0.0 - 5.0 %   Basophils Relative 0.4 0.0 - 3.0 %   Neutro Abs 3.2 1.4 - 7.7 K/uL   Lymphs Abs 1.8 0.7 - 4.0 K/uL   Monocytes Absolute 0.2 0.1 - 1.0 K/uL   Eosinophils Absolute 0.1 0.0 - 0.7 K/uL   Basophils Absolute 0.0 0.0 - 0.1 K/uL  Comp Met (CMET)     Status: None   Collection Time: 08/05/16 10:30 AM  Result Value Ref Range   Sodium 139 135 - 145 mEq/L   Potassium 4.0 3.5 - 5.1 mEq/L   Chloride 109 96 - 112 mEq/L   CO2 24 19 - 32 mEq/L   Glucose, Bld 86 70 - 99 mg/dL   BUN 6 6 - 23 mg/dL   Creatinine, Ser 1.42 0.40 - 1.20 mg/dL   Total Bilirubin 0.5 0.2 - 1.2 mg/dL   Alkaline Phosphatase 45 39 - 117 U/L   AST 10 0 - 37 U/L   ALT 5 0 - 35 U/L   Total Protein 7.0 6.0 - 8.3 g/dL   Albumin 4.1 3.5 - 5.2 g/dL   Calcium 8.9 8.4 - 12.9 mg/dL   GFR 248.14 >04.53 mL/min  TSH     Status: None   Collection Time: 08/05/16 10:30 AM  Result Value Ref Range   TSH 0.81 0.35 - 4.50 uIU/mL  Lipid panel     Status: None   Collection Time: 08/05/16 10:30 AM  Result Value Ref Range   Cholesterol 139 0 - 200 mg/dL    Comment: ATP III Classification       Desirable:  < 200 mg/dL               Borderline High:  200 - 239 mg/dL          High:  > = 968 mg/dL   Triglycerides 34.9 0.0 - 149.0 mg/dL    Comment: Normal:  <988 mg/dLBorderline High:  150 - 199 mg/dL   HDL 31.63 >61.95 mg/dL   VLDL 7.8 0.0 - 47.3 mg/dL   LDL Cholesterol 86 0 - 99 mg/dL   Total CHOL/HDL Ratio  3     Comment:                Men          Women1/2 Average Risk     3.4          3.3Average Risk          5.0          4.42X Average Risk          9.6          7.13X Average Risk          15.0          11.0                       NonHDL 94.01     Comment: NOTE:   Non-HDL goal should be 30 mg/dL higher than patient's LDL goal (i.e. LDL goal of < 70 mg/dL, would have non-HDL goal of < 100 mg/dL)  Wet prep, genital     Status: Abnormal   Collection Time: 08/05/16 10:33 AM  Result Value Ref Range   WBC, Wet Prep HPF POC Few(1-10/hpf) (A) None   Bacteria Moderate(11-16/hpf) (A) None   Trich, Wet Prep None Seen (A) None   Yeast Wet Prep HPF POC Few(<6/hpf) (A) None   Clue Cells Wet Prep HPF POC Many(13-20/hpf) (A) None   Follow up: Return if symptoms worsen or fail to improve.  Wilfred Lacy, NP

## 2016-08-05 NOTE — Progress Notes (Signed)
Normal results, see office note

## 2016-08-05 NOTE — Patient Instructions (Addendum)
Use wrist brace during the day and off at night. May use ibuprofen or naproxen OTC as needed for pain. Consider referral to sports medicine if no improvement.  Carpal Tunnel Syndrome Introduction Carpal tunnel syndrome is a condition that causes pain in your hand and arm. The carpal tunnel is a narrow area that is on the palm side of your wrist. Repeated wrist motion or certain diseases may cause swelling in the tunnel. This swelling can pinch the main nerve in the wrist (median nerve). Follow these instructions at home: If you have a splint:  Wear it as told by your doctor. Remove it only as told by your doctor.  Loosen the splint if your fingers:  Become numb and tingle.  Turn blue and cold.  Keep the splint clean and dry. General instructions  Take over-the-counter and prescription medicines only as told by your doctor.  Rest your wrist from any activity that may be causing your pain. If needed, talk to your employer about changes that can be made in your work, such as getting a wrist pad to use while typing.  If directed, apply ice to the painful area:  Put ice in a plastic bag.  Place a towel between your skin and the bag.  Leave the ice on for 20 minutes, 2-3 times per day.  Keep all follow-up visits as told by your doctor. This is important.  Do any exercises as told by your doctor, physical therapist, or occupational therapist. Contact a doctor if:  You have new symptoms.  Medicine does not help your pain.  Your symptoms get worse. This information is not intended to replace advice given to you by your health care provider. Make sure you discuss any questions you have with your health care provider. Document Released: 07/02/2011 Document Revised: 12/19/2015 Document Reviewed: 11/28/2014  2017 Elsevier

## 2016-08-05 NOTE — Progress Notes (Signed)
Pre visit review using our clinic review tool, if applicable. No additional management support is needed unless otherwise documented below in the visit note. 

## 2016-08-06 LAB — RPR

## 2016-08-06 NOTE — Progress Notes (Signed)
Normal results, see office note

## 2016-08-25 DIAGNOSIS — N898 Other specified noninflammatory disorders of vagina: Secondary | ICD-10-CM | POA: Diagnosis not present

## 2016-08-25 DIAGNOSIS — N926 Irregular menstruation, unspecified: Secondary | ICD-10-CM | POA: Diagnosis not present

## 2016-10-02 ENCOUNTER — Ambulatory Visit (HOSPITAL_COMMUNITY)
Admission: EM | Admit: 2016-10-02 | Discharge: 2016-10-02 | Disposition: A | Discharge status: Home / Self Care | Payer: BLUE CROSS/BLUE SHIELD | Attending: Emergency Medicine | Admitting: Emergency Medicine

## 2016-10-02 ENCOUNTER — Encounter (HOSPITAL_COMMUNITY): Payer: Self-pay | Admitting: Emergency Medicine

## 2016-10-02 DIAGNOSIS — L0291 Cutaneous abscess, unspecified: Secondary | ICD-10-CM

## 2016-10-02 DIAGNOSIS — L02415 Cutaneous abscess of right lower limb: Secondary | ICD-10-CM | POA: Insufficient documentation

## 2016-10-02 MED ORDER — SULFAMETHOXAZOLE-TRIMETHOPRIM 800-160 MG PO TABS: 2.0000 | ORAL_TABLET | Freq: Two times a day (BID) | ORAL | 0 refills | Status: DC

## 2016-10-02 MED ORDER — CHLORHEXIDINE GLUCONATE 4 % EX LIQD: Freq: Every day | CUTANEOUS | 0 refills | Status: DC | PRN

## 2016-10-02 MED ORDER — IBUPROFEN 600 MG PO TABS: 600.0000 mg | ORAL_TABLET | Freq: Four times a day (QID) | ORAL | 0 refills | Status: DC | PRN

## 2016-10-02 NOTE — ED Provider Notes (Signed)
HPI  SUBJECTIVE:  Kristine [name] is a 26 y.o. female who presents with a painful erythematous mass of gradually increasing size on her right lower extremity. She tried ibuprofen 800 mg with some improvement in her pain, no aggravating factors. Last dose of ibuprofen was within 6 hours of evaluation . States it started off as a small, nontender bump. States the erythema and pain started today. She had a similar lesion several weeks ago but it drained on its own. No N/V, fevers, drainage, lymphangitis.  No bodyaches, generalized weakness.  Does not recall insect bite, trauma to area. No contacts with similar lesions.   She has a past medical history of recurrent abscesses. No H/o MRSA skin infections. No h/o DM, HIV, artifical joints or heart valves. LMP: Now. She denies possibility of being pregnant. WUJ:[id number]:Charlotte Nche, NP    Past Medical History:  Diagnosis Date  . Anemia   . S/P cesarean section 01/06/2016  . STD (sexually transmitted disease) 03/2014   Chlamydia    Past Surgical History:  Procedure Laterality Date  . CESAREAN SECTION N/A 01/06/2016   Procedure: CESAREAN SECTION;  Surgeon: Sherian ReinJody Bovard-Stuckert, MD;  Location: [facility];  Service: Obstetrics;  Laterality: N/A;  . WISDOM TOOTH EXTRACTION      Family History  Problem Relation Age of Onset  . Multiple sclerosis Father   . Hypertension Mother   . Heart disease Maternal Grandmother     Social History  Substance Use Topics  . Smoking status: Never Smoker  . Smokeless tobacco: Never Used  . Alcohol use 1.0 oz/week    2 Standard drinks or equivalent per week    No current facility-administered medications for this encounter.   Current Outpatient Prescriptions:  .  Prenatal Vit-Fe Fumarate-FA (MULTIVITAMIN-PRENATAL) 27-0.8 MG TABS tablet, Take 1 tablet by mouth daily at 12 noon., Disp: , Rfl:  .  chlorhexidine (HIBICLENS) 4 % external liquid, Apply topically daily as needed. Dilute 10-15 mL in water, Use daily  when bathing for 1-2 weeks, Disp: 120 mL, Rfl: 0 .  etonogestrel (NEXPLANON) 68 MG IMPL implant, 1 each (68 mg total) by Subdermal route once., Disp: 1 each, Rfl: 0 .  ibuprofen (ADVIL,MOTRIN) 600 MG tablet, Take 1 tablet (600 mg total) by mouth every 6 (six) hours as needed., Disp: 30 tablet, Rfl: 0 .  sulfamethoxazole-trimethoprim (BACTRIM DS,SEPTRA DS) 800-160 MG tablet, Take 2 tablets by mouth 2 (two) times daily., Disp: 20 tablet, Rfl: 0  No Known Allergies   ROS  As noted in HPI.   Physical Exam  BP 124/69 (BP Location: Right Arm)   Pulse 90   Temp 99.1 F (37.3 C) (Oral)   Resp 19   LMP 09/28/2016 (Exact Date)   SpO2 100%   Constitutional: Well developed, well nourished, no acute distress Eyes:  EOMI, conjunctiva normal bilaterally HENT: Normocephalic, atraumatic,mucus membranes moist Respiratory: Normal inspiratory effort Cardiovascular: Normal rate GI: nondistended skin: 6 x 7 cm area of tender erythema, induration right anterior lower leg with no expressible purulent drainage. Marked this with a marker for reference. Musculoskeletal: no deformities Neurologic: Alert & oriented x 3, no focal neuro deficits Psychiatric: Speech and behavior appropriate   ED Course   Medications - No data to display  Orders Placed This Encounter  Procedures  . Wound or Superficial Culture    Standing Status:   Standing    Number of Occurrences:   1    Order Specific Question:   Patient immune status  Answer:   Normal  . Apply dry sterile dressing    Standing Status:   Standing    Number of Occurrences:   1    No results found for this or any previous visit (from the past 24 hour(s)). No results found.  ED Clinical Impression  Abscess  ED Assessment/Plan  Procedure note: Cleaned the area extensively with iodine and alcohol. Then anesthetized the area via local infiltration with 1.5 cc of lidocaine 1% with epinephrine. Made a linear 1 cm incision with an 11 blade and  expressed a moderate amount of purulence drainage and blood. Probed wound to break up loculations. Culture sent. Then irrigated the wound with 250 cc of sterile saline. Packing was not placed. Sterile pressure dressing applied. Patient tolerated procedure well.  Home with Bactrim, ibuprofen 600 mg 1 g of Tylenol 3-4 times a day, chlorhexidine soap as she seems to get these frequently. Advised patient give [place] a working phone numbers to contact her if we need to change her antibiotics. She will follow-up here or with her primary care physician if not getting significantly better in 2 days, she is to go to the ER if she gets worse.   Discussed  MDM, plan and followup with patient. Discussed sn/sx that should prompt return to the  ED. Patient agrees with plan.  Meds ordered this encounter  Medications  . ibuprofen (ADVIL,MOTRIN) 600 MG tablet    Sig: Take 1 tablet (600 mg total) by mouth every 6 (six) hours as needed.    Dispense:  30 tablet    Refill:  0  . sulfamethoxazole-trimethoprim (BACTRIM DS,SEPTRA DS) 800-160 MG tablet    Sig: Take 2 tablets by mouth 2 (two) times daily.    Dispense:  20 tablet    Refill:  0  . chlorhexidine (HIBICLENS) 4 % external liquid    Sig: Apply topically daily as needed. Dilute 10-15 mL in water, Use daily when bathing for 1-2 weeks    Dispense:  120 mL    Refill:  0    *This clinic note was created using Scientist, clinical (histocompatibility and immunogenetics). Therefore, there may be occasional mistakes despite careful proofreading.  ?    Domenick Gong, MD 10/02/16 [id number]

## 2016-10-02 NOTE — ED Triage Notes (Signed)
Pt stated two weeks ago the bump on her right leg started to drain and it went away, yesterday she noticed that the same bump, is present again and very painful. [facility] CMA Student.

## 2016-10-02 NOTE — Discharge Instructions (Signed)
Return here or follow up with your doctor in 2 days for a wound check if you're not getting significantly better. Give us a working phone number so that we contact you if we need to change your antibiotics. Take the medication as written. Take 1 gram of tylenol with the motrin up to 4 times a day as needed for pain and fever. This is an effective combination for pain.

## 2016-10-05 LAB — AEROBIC CULTURE  (SUPERFICIAL SPECIMEN)

## 2016-10-05 LAB — AEROBIC CULTURE W GRAM STAIN (SUPERFICIAL SPECIMEN)

## 2016-10-12 ENCOUNTER — Ambulatory Visit (INDEPENDENT_AMBULATORY_CARE_PROVIDER_SITE_OTHER): Payer: BLUE CROSS/BLUE SHIELD | Admitting: Nurse Practitioner

## 2016-10-12 ENCOUNTER — Encounter: Payer: Self-pay | Admitting: Nurse Practitioner

## 2016-10-12 VITALS — BP 108/78 | HR 88 | Temp 98.5°F | Ht 61.0 in | Wt 165.0 lb

## 2016-10-12 DIAGNOSIS — L02415 Cutaneous abscess of right lower limb: Secondary | ICD-10-CM | POA: Diagnosis not present

## 2016-10-12 DIAGNOSIS — L270 Generalized skin eruption due to drugs and medicaments taken internally: Secondary | ICD-10-CM | POA: Diagnosis not present

## 2016-10-12 MED ORDER — DIPHENHYDRAMINE HCL 25 MG PO TABS: 25.0000 mg | ORAL_TABLET | Freq: Three times a day (TID) | ORAL | 0 refills | Status: DC | PRN

## 2016-10-12 MED ORDER — CLINDAMYCIN HCL 300 MG PO CAPS: 300.0000 mg | ORAL_CAPSULE | Freq: Three times a day (TID) | ORAL | 0 refills | Status: DC

## 2016-10-12 MED ORDER — METHYLPREDNISOLONE ACETATE 80 MG/ML IJ SUSP
80.0000 mg | Freq: Once | INTRAMUSCULAR | Status: AC
  Administered 2016-10-12: 80 mg via INTRAMUSCULAR

## 2016-10-12 MED ORDER — METHYLPREDNISOLONE ACETATE 40 MG/ML IJ SUSP: 40.0000 mg | Freq: Once | INTRAMUSCULAR | Status: DC

## 2016-10-12 MED ORDER — CEPHALEXIN 500 MG PO CAPS
500.0000 mg | ORAL_CAPSULE | Freq: Three times a day (TID) | ORAL | 0 refills | Status: DC
Start: 2016-10-12 — End: 2016-10-12

## 2016-10-12 MED ORDER — CALAMINE EX LOTN: 1.0000 "application " | TOPICAL_LOTION | CUTANEOUS | 0 refills | Status: DC | PRN

## 2016-10-12 NOTE — Patient Instructions (Signed)
Stop bactrim  Wait 2days, then start new oral antibiotics.  Use warm compress 2-3times a day to right lower leg abscess ( at a time).  Return to office if no improvement of rash in 3days

## 2016-10-12 NOTE — Progress Notes (Signed)
Subjective:  Patient ID: Kristine [name], female    DOB: 1991/04/29  Age: 26 y.o. MRN: [id number]  CC: Hospitalization Follow-up and Rash   Rash  This is a new problem. The current episode started yesterday. The problem is unchanged. The rash is diffuse. The rash is characterized by redness and itchiness. Associated with: use of bactrim x 9days. Associated symptoms include fatigue. Pertinent negatives include no anorexia, congestion, cough, diarrhea, eye pain, facial edema, fever, joint pain, nail changes, rhinorrhea, shortness of breath, sore throat or vomiting. Past treatments include nothing. There is no history of allergies, asthma, eczema or varicella.    Right Leg Abscess: Also wants leg abscess to be re evaluated. Treated with bactrim and hibiclens soap. Reports improvement in abscess, no leg pain, resolution of drainage.  Outpatient Medications Prior to Visit  Medication Sig Dispense Refill  . chlorhexidine (HIBICLENS) 4 % external liquid Apply topically daily as needed. Dilute 10-15 mL in water, Use daily when bathing for 1-2 weeks 120 mL 0  . etonogestrel (NEXPLANON) 68 MG IMPL implant 1 each (68 mg total) by Subdermal route once. 1 each 0  . ibuprofen (ADVIL,MOTRIN) 600 MG tablet Take 1 tablet (600 mg total) by mouth every 6 (six) hours as needed. 30 tablet 0  . Prenatal Vit-Fe Fumarate-FA (MULTIVITAMIN-PRENATAL) 27-0.8 MG TABS tablet Take 1 tablet by mouth daily at 12 noon.    . sulfamethoxazole-trimethoprim (BACTRIM DS,SEPTRA DS) 800-160 MG tablet Take 2 tablets by mouth 2 (two) times daily. 20 tablet 0   No facility-administered medications prior to visit.     ROS See HPI  Objective:  BP 108/78   Pulse 88   Temp 98.5 F (36.9 C)   Ht 5\' 1"  (1.549 m)   Wt 165 lb (74.8 kg)   LMP 09/28/2016 (Exact Date)   SpO2 98%   BMI 31.18 kg/m   BP Readings from Last 3 Encounters:  10/12/16 108/78  10/02/16 124/69  08/05/16 128/72    Wt Readings from Last 3 Encounters:    10/12/16 165 lb (74.8 kg)  08/05/16 174 lb (78.9 kg)  01/05/16 214 lb (97.1 kg)    Physical Exam  Constitutional: No distress.  Cardiovascular: Normal rate.   Pulmonary/Chest: Effort normal.  Musculoskeletal: Normal range of motion.  Skin: Skin is warm and dry. Rash noted. Rash is macular. There is erythema.     Vitals reviewed.   Lab Results  Component Value Date   WBC 5.3 08/05/2016   HGB 12.4 08/05/2016   HCT 35.7 (L) 08/05/2016   PLT 155.0 08/05/2016   GLUCOSE 86 08/05/2016   CHOL 139 08/05/2016   TRIG 39.0 08/05/2016   HDL 44.90 08/05/2016   LDLCALC 86 08/05/2016   ALT 5 08/05/2016   AST 10 08/05/2016   NA 139 08/05/2016   K 4.0 08/05/2016   CL 109 08/05/2016   CREATININE 0.68 08/05/2016   BUN 6 08/05/2016   CO2 24 08/05/2016   TSH 0.81 08/05/2016    No results found.  Assessment & Plan:   [name] was seen today for hospitalization follow-up and rash.  Diagnoses and all orders for this visit:  Allergic drug rash -     Discontinue: methylPREDNISolone acetate (DEPO-MEDROL) injection 40 mg; Inject 1 mL (40 mg total) into the muscle once. -     diphenhydrAMINE (BENADRYL) 25 MG tablet; Take 1 tablet (25 mg total) by mouth every 8 (eight) hours as needed for itching. -     calamine lotion; Apply 1  application topically as needed for itching. -     methylPREDNISolone acetate (DEPO-MEDROL) injection 80 mg; Inject 1 mL (80 mg total) into the muscle once.  Abscess of leg without foot, right -     Discontinue: cephALEXin (KEFLEX) 500 MG capsule; Take 1 capsule (500 mg total) by mouth 3 (three) times daily. -     clindamycin (CLEOCIN) 300 MG capsule; Take 1 capsule (300 mg total) by mouth 3 (three) times daily.   I have discontinued Ms. Schillaci sulfamethoxazole-trimethoprim and cephALEXin. I am also having her start on diphenhydrAMINE, calamine, and clindamycin. Additionally, I am having her maintain her multivitamin-prenatal, etonogestrel, ibuprofen, and  chlorhexidine. We will continue to administer methylPREDNISolone acetate.  Meds ordered this encounter  Medications  . DISCONTD: methylPREDNISolone acetate (DEPO-MEDROL) injection 40 mg  . diphenhydrAMINE (BENADRYL) 25 MG tablet    Sig: Take 1 tablet (25 mg total) by mouth every 8 (eight) hours as needed for itching.    Dispense:  30 tablet    Refill:  0    Order Specific Question:   Supervising Provider    Answer:   Tresa Garter [[id number]]  . DISCONTD: cephALEXin (KEFLEX) 500 MG capsule    Sig: Take 1 capsule (500 mg total) by mouth 3 (three) times daily.    Dispense:  15 capsule    Refill:  0    Order Specific Question:   Supervising Provider    Answer:   Tresa Garter [[id number]]  . calamine lotion    Sig: Apply 1 application topically as needed for itching.    Dispense:  120 mL    Refill:  0    Order Specific Question:   Supervising Provider    Answer:   Tresa Garter [[id number]]  . methylPREDNISolone acetate (DEPO-MEDROL) injection 80 mg  . clindamycin (CLEOCIN) 300 MG capsule    Sig: Take 1 capsule (300 mg total) by mouth 3 (three) times daily.    Dispense:  15 capsule    Refill:  0    Do not fill cephalexin.    Order Specific Question:   Supervising Provider    Answer:   Tresa Garter [[id number]]    Follow-up: Return if symptoms worsen or fail to improve.  Alysia Penna, NP

## 2017-02-03 DIAGNOSIS — H7291 Unspecified perforation of tympanic membrane, right ear: Secondary | ICD-10-CM | POA: Diagnosis not present

## 2017-02-03 DIAGNOSIS — H6691 Otitis media, unspecified, right ear: Secondary | ICD-10-CM | POA: Diagnosis not present

## 2017-02-03 DIAGNOSIS — H66011 Acute suppurative otitis media with spontaneous rupture of ear drum, right ear: Secondary | ICD-10-CM | POA: Diagnosis not present

## 2017-04-06 DIAGNOSIS — Z1389 Encounter for screening for other disorder: Secondary | ICD-10-CM | POA: Diagnosis not present

## 2017-04-06 DIAGNOSIS — Z202 Contact with and (suspected) exposure to infections with a predominantly sexual mode of transmission: Secondary | ICD-10-CM | POA: Diagnosis not present

## 2017-04-06 DIAGNOSIS — Z3040 Encounter for surveillance of contraceptives, unspecified: Secondary | ICD-10-CM | POA: Diagnosis not present

## 2017-04-06 DIAGNOSIS — Z01419 Encounter for gynecological examination (general) (routine) without abnormal findings: Secondary | ICD-10-CM | POA: Diagnosis not present

## 2017-04-06 DIAGNOSIS — R87612 Low grade squamous intraepithelial lesion on cytologic smear of cervix (LGSIL): Secondary | ICD-10-CM | POA: Diagnosis not present

## 2017-04-06 DIAGNOSIS — Z1151 Encounter for screening for human papillomavirus (HPV): Secondary | ICD-10-CM | POA: Diagnosis not present

## 2017-04-06 DIAGNOSIS — Z130 Encounter for screening for diseases of the blood and blood-forming organs and certain disorders involving the immune mechanism: Secondary | ICD-10-CM | POA: Diagnosis not present

## 2017-04-06 DIAGNOSIS — D259 Leiomyoma of uterus, unspecified: Secondary | ICD-10-CM | POA: Diagnosis not present

## 2017-04-06 DIAGNOSIS — Z113 Encounter for screening for infections with a predominantly sexual mode of transmission: Secondary | ICD-10-CM | POA: Diagnosis not present

## 2017-04-06 DIAGNOSIS — Z6829 Body mass index (BMI) 29.0-29.9, adult: Secondary | ICD-10-CM | POA: Diagnosis not present

## 2017-04-06 DIAGNOSIS — Z124 Encounter for screening for malignant neoplasm of cervix: Secondary | ICD-10-CM | POA: Diagnosis not present

## 2017-04-21 DIAGNOSIS — N852 Hypertrophy of uterus: Secondary | ICD-10-CM | POA: Diagnosis not present

## 2017-04-26 DIAGNOSIS — N870 Mild cervical dysplasia: Secondary | ICD-10-CM | POA: Diagnosis not present

## 2017-04-26 DIAGNOSIS — Z3202 Encounter for pregnancy test, result negative: Secondary | ICD-10-CM | POA: Diagnosis not present

## 2017-04-26 DIAGNOSIS — R87612 Low grade squamous intraepithelial lesion on cytologic smear of cervix (LGSIL): Secondary | ICD-10-CM | POA: Diagnosis not present

## 2017-05-11 DIAGNOSIS — L232 Allergic contact dermatitis due to cosmetics: Secondary | ICD-10-CM | POA: Diagnosis not present

## 2017-06-23 DIAGNOSIS — R509 Fever, unspecified: Secondary | ICD-10-CM | POA: Diagnosis not present

## 2017-06-23 DIAGNOSIS — R062 Wheezing: Secondary | ICD-10-CM | POA: Diagnosis not present

## 2017-08-20 DIAGNOSIS — Z3009 Encounter for other general counseling and advice on contraception: Secondary | ICD-10-CM | POA: Diagnosis not present

## 2017-09-07 DIAGNOSIS — Z3046 Encounter for surveillance of implantable subdermal contraceptive: Secondary | ICD-10-CM | POA: Diagnosis not present

## 2017-09-07 DIAGNOSIS — Z30430 Encounter for insertion of intrauterine contraceptive device: Secondary | ICD-10-CM | POA: Diagnosis not present

## 2017-11-05 DIAGNOSIS — Z30431 Encounter for routine checking of intrauterine contraceptive device: Secondary | ICD-10-CM | POA: Diagnosis not present

## 2017-12-21 ENCOUNTER — Encounter: Payer: Self-pay | Admitting: Nurse Practitioner

## 2017-12-22 ENCOUNTER — Ambulatory Visit: Payer: BLUE CROSS/BLUE SHIELD | Admitting: Family Medicine

## 2017-12-22 ENCOUNTER — Encounter: Payer: Self-pay | Admitting: Family Medicine

## 2017-12-22 DIAGNOSIS — M79672 Pain in left foot: Secondary | ICD-10-CM

## 2017-12-22 DIAGNOSIS — S93422A Sprain of deltoid ligament of left ankle, initial encounter: Secondary | ICD-10-CM

## 2017-12-22 HISTORY — DX: Pain in left foot: M79.672

## 2017-12-22 MED ORDER — IBUPROFEN 800 MG PO TABS: 800.0000 mg | ORAL_TABLET | Freq: Three times a day (TID) | ORAL | 0 refills | Status: DC | PRN

## 2017-12-22 NOTE — Assessment & Plan Note (Signed)
Discussed sprain and estimated recover time Continue ROM exercises and icing as needed May start light exercises again in 1-2 days as tolerated Rx for ibuprofen , may use prn.  ACE bandage applied today for comfort and compression, recommend that she try an OTC ankle sleeve or brace for exercise.

## 2017-12-22 NOTE — Patient Instructions (Signed)
Ankle Sprain  An ankle sprain is a stretch or tear in one of the tough tissues (ligaments) in your ankle.  Follow these instructions at home:   Rest your ankle.   Take over-the-counter and prescription medicines only as told by your doctor.   For 2-3 days, keep your ankle higher than the level of your heart (elevated) as much as possible.   If directed, put ice on the area:  ? Put ice in a plastic bag.  ? Place a towel between your skin and the bag.  ? Leave the ice on for 20 minutes, 2-3 times a day.   If you were given a brace:  ? Wear it as told.  ? Take it off to shower or bathe.  ? Try not to move your ankle much, but wiggle your toes from time to time. This helps to prevent swelling.   If you were given an elastic bandage (dressing):  ? Take it off when you shower or bathe.  ? Try not to move your ankle much, but wiggle your toes from time to time. This helps to prevent swelling.  ? Adjust the bandage to make it more comfortable if it feels too tight.  ? Loosen the bandage if you lose feeling in your foot, your foot tingles, or your foot gets cold and blue.   If you have crutches, use them as told by your doctor. Continue to use them until you can walk without feeling pain in your ankle.  Contact a doctor if:   Your bruises or swelling are quickly getting worse.   Your pain does not get better after you take medicine.  Get help right away if:   You cannot feel your toes or foot.   Your toes or your foot looks blue.   You have very bad pain that gets worse.  This information is not intended to replace advice given to you by your health care provider. Make sure you discuss any questions you have with your health care provider.  Document Released: 12/30/2007 Document Revised: 12/19/2015 Document Reviewed: 02/12/2015  Elsevier Interactive Patient Education  2018 [employer]

## 2017-12-22 NOTE — Progress Notes (Signed)
Kristine [name] - 27 y.o. female MRN [id number]  Date of birth: Aug 05, 1990  Subjective Chief Complaint  Patient presents with  . left ankle injury    HPI Kristine [name] is a 27 y.o. female here today for same day visit with complaint of L ankle pain.  States that she was working out during a Zumba class when she rolled her ankle medially.  Had some mild pain initially however feels like pain has gotten worse since that time.  She is able to weight bear and walk without siginificant difficulty.  She is using OTC ibuprofen and icing which has been helpful.  She has also started doing some ROM exercises.  She denies numbness, tingling or significant swelling.    ROS: ROS completed and negative except as noted per HPI Allergies  Allergen Reactions  . Bactrim [Sulfamethoxazole-Trimethoprim] Rash    Past Medical History:  Diagnosis Date  . Anemia   . S/P cesarean section 01/06/2016  . STD (sexually transmitted disease) 03/2014   Chlamydia    Past Surgical History:  Procedure Laterality Date  . CESAREAN SECTION N/A 01/06/2016   Procedure: CESAREAN SECTION;  Surgeon: Sherian Rein, MD;  Location: [facility];  Service: Obstetrics;  Laterality: N/A;  . WISDOM TOOTH EXTRACTION      Social History   Socioeconomic History  . Marital status: Single    Spouse name: Not on file  . Number of children: Not on file  . Years of education: Not on file  . Highest education level: Not on file  Occupational History  . Not on file  Social Needs  . Financial resource strain: Not on file  . Food insecurity:    Worry: Not on file    Inability: Not on file  . Transportation needs:    Medical: Not on file    Non-medical: Not on file  Tobacco Use  . Smoking status: Never Smoker  . Smokeless tobacco: Never Used  Substance and Sexual Activity  . Alcohol use: Yes    Alcohol/week: 1.0 oz    Types: 2 Standard drinks or equivalent per week  . Drug use: No  . Sexual activity: Yes   Partners: Male    Birth control/protection: Condom, Implant  Lifestyle  . Physical activity:    Days per week: Not on file    Minutes per session: Not on file  . Stress: Not on file  Relationships  . Social connections:    Talks on phone: Not on file    Gets together: Not on file    Attends religious service: Not on file    Active member of club or organization: Not on file    Attends meetings of clubs or organizations: Not on file    Relationship status: Not on file  Other Topics Concern  . Not on file  Social History Narrative  . Not on file    Family History  Problem Relation Age of Onset  . Multiple sclerosis Father   . Hypertension Mother   . Heart disease Maternal Grandmother     Health Maintenance  Topic Date Due  . PAP SMEAR  04/17/2017  . INFLUENZA VACCINE  02/24/2018  . TETANUS/TDAP  01/06/2026  . HIV Screening  Completed    ----------------------------------------------------------------------------------------------------------------------------------------------------------------------------------------------------------------- Physical Exam BP 118/80   Pulse 68   Ht  (1.549 m)   Wt 168 lb (76.2 kg)   SpO2 100%   BMI 31.74 kg/m   Physical Exam  Constitutional: She is oriented to  person, place, and time. She appears well-nourished. No distress.  Musculoskeletal:  L ankle is normal to inspection without significant swelling/effusion.  She has ttp along the deltoid complex and mild pain with resisted inversion and flexion of the great toe.  She has normal strength throughout and normal pulses and sensation.    Neurological: She is alert and oriented to person, place, and time.  Skin: Skin is warm and dry. No rash noted.  Psychiatric: She has a normal mood and affect. Her behavior is normal.     ------------------------------------------------------------------------------------------------------------------------------------------------------------------------------------------------------------------- Assessment and Plan  Sprain, ankle joint, medial, left, initial encounter Discussed sprain and estimated recover time Continue ROM exercises and icing as needed May start light exercises again in 1-2 days as tolerated Rx for ibuprofen , may use prn.  ACE bandage applied today for comfort and compression, recommend that she try an OTC ankle sleeve or brace for exercise.

## 2017-12-29 ENCOUNTER — Other Ambulatory Visit: Payer: Self-pay | Admitting: Family Medicine

## 2017-12-29 ENCOUNTER — Telehealth: Payer: Self-pay | Admitting: Emergency Medicine

## 2017-12-29 DIAGNOSIS — S99912A Unspecified injury of left ankle, initial encounter: Secondary | ICD-10-CM

## 2017-12-29 NOTE — Telephone Encounter (Signed)
Sports med referral placed.

## 2017-12-29 NOTE — Telephone Encounter (Signed)
Patient is aware that referral has been placed. Nothing further needed.

## 2017-12-29 NOTE — Telephone Encounter (Signed)
Copied from CRM [id number]. Topic: Appointment Scheduling - Scheduling Inquiry for Clinic >> Dec 29, 2017 12:13 PM Windy KalataMichael, Taylor L, NT wrote: Reason for CRM: patient is calling and states that when she was seen on 12/22/17 for a left ankle injury she was to call back if it was not better so that further test could be done. She states that it is still painful and there is a ball on the side and bottom of her foot. She would like a call back to see if a appt is neccessary or what is the next step .Please advise if patient should come in for an appointment.

## 2018-01-03 ENCOUNTER — Ambulatory Visit (INDEPENDENT_AMBULATORY_CARE_PROVIDER_SITE_OTHER): Payer: BLUE CROSS/BLUE SHIELD

## 2018-01-03 ENCOUNTER — Ambulatory Visit (INDEPENDENT_AMBULATORY_CARE_PROVIDER_SITE_OTHER): Payer: BLUE CROSS/BLUE SHIELD | Admitting: Family Medicine

## 2018-01-03 ENCOUNTER — Encounter: Payer: Self-pay | Admitting: Family Medicine

## 2018-01-03 VITALS — BP 124/78 | HR 71 | Ht 61.0 in | Wt 169.0 lb

## 2018-01-03 DIAGNOSIS — S93422A Sprain of deltoid ligament of left ankle, initial encounter: Secondary | ICD-10-CM

## 2018-01-03 DIAGNOSIS — M79672 Pain in left foot: Secondary | ICD-10-CM

## 2018-01-03 MED ORDER — DICLOFENAC SODIUM 2 % TD SOLN: 1.0000 "application " | Freq: Two times a day (BID) | TRANSDERMAL | 3 refills | Status: DC

## 2018-01-03 NOTE — Progress Notes (Signed)
Kristine [name] - 27 y.o. female MRN [id number]  Date of birth: 08-01-1990  SUBJECTIVE:  Including CC & ROS.  Chief Complaint  Patient presents with  . Ankle Pain    Kristine [name] is a 27 y.o. female that is presenting with with left foot pain. She was in zumba when and rolled her ankle. Pain is constant. Having pain over the navicular, plantar aspect of the calcaneous and the base of the 5th MT. The pain hasn't improved with the brace she has bee using. She is unsure of the exact mechanism.  Admits to some tingling and numbness. She has been wearing a brace daily. She has been taking Motrin and applying ice. Pain is severe to mild when walking and standing.    Review of Systems  Constitutional: Negative for fever.  HENT: Negative for congestion.   Respiratory: Negative for stridor.   Cardiovascular: Negative for chest pain.  Gastrointestinal: Negative for abdominal pain.  Musculoskeletal: Positive for gait problem.  Skin: Negative for color change.  Neurological: Negative for weakness.  Hematological: Negative for adenopathy.  Psychiatric/Behavioral: Negative for agitation.    HISTORY: Past Medical, Surgical, Social, and Family History Reviewed & Updated per EMR.   Pertinent Historical Findings include:  Past Medical History:  Diagnosis Date  . Anemia   . S/P cesarean section 01/06/2016  . STD (sexually transmitted disease) 03/2014   Chlamydia    Past Surgical History:  Procedure Laterality Date  . CESAREAN SECTION N/A 01/06/2016   Procedure: CESAREAN SECTION;  Surgeon: Sherian ReinJody Bovard-Stuckert, MD;  Location: [facility];  Service: Obstetrics;  Laterality: N/A;  . WISDOM TOOTH EXTRACTION      Allergies  Allergen Reactions  . Bactrim [Sulfamethoxazole-Trimethoprim] Rash    Family History  Problem Relation Age of Onset  . Multiple sclerosis Father   . Hypertension Mother   . Heart disease Maternal Grandmother      Social History   Socioeconomic History  . Marital  status: Single    Spouse name: Not on file  . Number of children: Not on file  . Years of education: Not on file  . Highest education level: Not on file  Occupational History  . Not on file  Social Needs  . Financial resource strain: Not on file  . Food insecurity:    Worry: Not on file    Inability: Not on file  . Transportation needs:    Medical: Not on file    Non-medical: Not on file  Tobacco Use  . Smoking status: Never Smoker  . Smokeless tobacco: Never Used  Substance and Sexual Activity  . Alcohol use: Yes    Alcohol/week: 1.0 oz    Types: 2 Standard drinks or equivalent per week  . Drug use: No  . Sexual activity: Yes    Partners: Male    Birth control/protection: Condom, Implant  Lifestyle  . Physical activity:    Days per week: Not on file    Minutes per session: Not on file  . Stress: Not on file  Relationships  . Social connections:    Talks on phone: Not on file    Gets together: Not on file    Attends religious service: Not on file    Active member of club or organization: Not on file    Attends meetings of clubs or organizations: Not on file    Relationship status: Not on file  . Intimate partner violence:    Fear of current or ex partner: Not on  file    Emotionally abused: Not on file    Physically abused: Not on file    Forced sexual activity: Not on file  Other Topics Concern  . Not on file  Social History Narrative  . Not on file     PHYSICAL EXAM:  VS: BP 124/78 (BP Location: Left Arm, Patient Position: Sitting, Cuff Size: Normal)   Pulse 71   Ht 5\' 1"  (1.549 m)   Wt 169 lb (76.7 kg)   SpO2 99%   BMI 31.93 kg/m  Physical Exam Gen: NAD, alert, cooperative with exam, well-appearing ENT: normal lips, normal nasal mucosa,  Eye: normal EOM, normal conjunctiva and lids CV:  no edema, +2 pedal pulses   Resp: no accessory muscle use, non-labored,  Skin: no rashes, no areas of induration  Neuro: normal tone, normal sensation to  touch Psych:  normal insight, alert and oriented MSK:  Left foot:  TTP of the navicular and base of the 5th MT Swelling and TTP of the plantar calcaneous  Normal anterior drawer  Normal ankle AROM  neurovascularly intact   Limited ultrasound: left foot:  Accessory navicular bone. Normal insertion of posterior tib.  Normal appearing posterior tib in tarsal tunnel  Normal appearing PF. Hypoechoic change superficial to the PF that could suggest effusion  Normal appearing base of the MT and insertion of the peroneal brevis   Summary: accessory navicular. Possible hematoma near origin of PF.   Ultrasound and interpretation by Clare Gandy, MD      ASSESSMENT & PLAN:   Left foot pain Pain seems to be associated with a sprain of her accessory navicular bone.  Normal-appearing tendons.  Does appear that she has some swelling on the plantar aspect of her foot from the injury she sustained.  Does not appear that the plantar fascia is torn.  No fracture was appreciated on ultrasound or x-ray of the base of the fifth metatarsal. -Pennsaid -X-ray -Counseled on conservative care. -Advised to follow-up in 2 weeks.  If no improvement may need to consider to make a nonweightbearing.

## 2018-01-03 NOTE — Patient Instructions (Addendum)
Nice to meet you  Please try to rub the medication over the area of pain  Please try to use a compression sock  Please try to ice if you can  Please try to obtain the cushion Please follow up with me in two weeks.

## 2018-01-04 DIAGNOSIS — M7989 Other specified soft tissue disorders: Secondary | ICD-10-CM | POA: Diagnosis not present

## 2018-01-04 NOTE — Assessment & Plan Note (Signed)
Pain seems to be associated with a sprain of her accessory navicular bone.  Normal-appearing tendons.  Does appear that she has some swelling on the plantar aspect of her foot from the injury she sustained.  Does not appear that the plantar fascia is torn.  No fracture was appreciated on ultrasound or x-ray of the base of the fifth metatarsal. -Pennsaid -X-ray -Counseled on conservative care. -Advised to follow-up in 2 weeks.  If no improvement may need to consider to make a nonweightbearing.

## 2018-01-12 ENCOUNTER — Ambulatory Visit: Payer: Self-pay | Admitting: *Deleted

## 2018-01-12 ENCOUNTER — Ambulatory Visit: Payer: BLUE CROSS/BLUE SHIELD | Admitting: Family Medicine

## 2018-01-12 DIAGNOSIS — M79672 Pain in left foot: Secondary | ICD-10-CM | POA: Diagnosis not present

## 2018-01-12 MED ORDER — AMBULATORY NON FORMULARY MEDICATION: 0 refills | Status: DC

## 2018-01-12 NOTE — Telephone Encounter (Signed)
Pt reports left foot pain , mostly at bottom of foot, 6-7/10 at rest 10/10 with ambulation.  Pt seen 01/03/18 "Sprain of accessory navicular bone".  States pain medications ineffective, "No relief." States "Hard to stay off of my foot."  Reports mild swelling, no numbness. States has been using insert, brace. Appt. made for today with Dr. Jordan LikesSchmitz.  Care advise given.   Reason for Disposition . [1] SEVERE pain (e.g., excruciating, unable to do any normal activities) AND [2] not improved after 2 hours of pain medicine  Answer Assessment - Initial Assessment Questions 1. ONSET: "When did the pain start?"      01/03/18 2. LOCATION: "Where is the pain located?"      Mostly bottom of foot 3. PAIN: "How bad is the pain?"    (Scale 1-10; or mild, moderate, severe)   -  MILD (1-3): doesn't interfere with normal activities    -  MODERATE (4-7): interferes with normal activities (e.g., work or school) or awakens from sleep, limping    -  SEVERE (8-10): excruciating pain, unable to do any normal activities, unable to walk     8-9/10, 10/10 with ambulation. 4. WORK OR EXERCISE: "Has there been any recent work or exercise that involved this part of the body?"      Seen 01/03/18 by Dr. Jordan LikesSchmitz 5. CAUSE: "What do you think is causing the foot pain?"     Seen 6/10.Marland Kitchen.."sprain of accessory navicular bone." 6. OTHER SYMPTOMS: "Do you have any other symptoms?" (e.g., leg pain, rash, fever, numbness)     no 7. PREGNANCY: "Is there any chance you are pregnant?" "When was your last menstrual period?"     no  Protocols used: FOOT PAIN-A-AH

## 2018-01-12 NOTE — Telephone Encounter (Signed)
fyi

## 2018-01-12 NOTE — Progress Notes (Signed)
Kristine [name] - 27 y.o. female MRN [id number]  Date of birth: September 20, 1990  SUBJECTIVE:  Including CC & ROS.  Chief Complaint  Patient presents with  . Follow-up    Kristine [name] is a 27 y.o. female that is here today for left foot pain follow up. She reports her pain has not improved. She has been wearing a brace daily and applying Pennsaid with no improvement. Mild to severe when walking. She has been wearing a horseshoe insert when she is at work. Reports feeling a shooting pain on the plantar aspect when she is ambulating. Still having pain in the midfoot at the navicular.      Review of Systems  Gastrointestinal: Negative for abdominal pain.  Musculoskeletal: Positive for gait problem.  Neurological: Negative for weakness.  Hematological: Negative for adenopathy.    HISTORY: Past Medical, Surgical, Social, and Family History Reviewed & Updated per EMR.   Pertinent Historical Findings include:  Past Medical History:  Diagnosis Date  . Anemia   . S/P cesarean section 01/06/2016  . STD (sexually transmitted disease) 03/2014   Chlamydia    Past Surgical History:  Procedure Laterality Date  . CESAREAN SECTION N/A 01/06/2016   Procedure: CESAREAN SECTION;  Surgeon: Sherian ReinJody Bovard-Stuckert, MD;  Location: [facility];  Service: Obstetrics;  Laterality: N/A;  . WISDOM TOOTH EXTRACTION      Allergies  Allergen Reactions  . Bactrim [Sulfamethoxazole-Trimethoprim] Rash    Family History  Problem Relation Age of Onset  . Multiple sclerosis Father   . Hypertension Mother   . Heart disease Maternal Grandmother      Social History   Socioeconomic History  . Marital status: Single    Spouse name: Not on file  . Number of children: Not on file  . Years of education: Not on file  . Highest education level: Not on file  Occupational History  . Not on file  Social Needs  . Financial resource strain: Not on file  . Food insecurity:    Worry: Not on file    Inability: Not  on file  . Transportation needs:    Medical: Not on file    Non-medical: Not on file  Tobacco Use  . Smoking status: Never Smoker  . Smokeless tobacco: Never Used  Substance and Sexual Activity  . Alcohol use: Yes    Alcohol/week: 1.2 oz    Types: 2 Standard drinks or equivalent per week  . Drug use: No  . Sexual activity: Yes    Partners: Male    Birth control/protection: Condom, Implant  Lifestyle  . Physical activity:    Days per week: Not on file    Minutes per session: Not on file  . Stress: Not on file  Relationships  . Social connections:    Talks on phone: Not on file    Gets together: Not on file    Attends religious service: Not on file    Active member of club or organization: Not on file    Attends meetings of clubs or organizations: Not on file    Relationship status: Not on file  . Intimate partner violence:    Fear of current or ex partner: Not on file    Emotionally abused: Not on file    Physically abused: Not on file    Forced sexual activity: Not on file  Other Topics Concern  . Not on file  Social History Narrative  . Not on file     PHYSICAL EXAM:  VS: BP (!) 112/58 (BP Location: Left Arm, Patient Position: Sitting, Cuff Size: Normal)   Pulse 72   Ht 5\' 1"  (1.549 m)   BMI 31.93 kg/m  Physical Exam Gen: NAD, alert, cooperative with exam, well-appearing ENT: normal lips, normal nasal mucosa,  Eye: normal EOM, normal conjunctiva and lids CV:  no edema, +2 pedal pulses   Resp: no accessory muscle use, non-labored,  Skin: no rashes, no areas of induration  Neuro: normal tone, normal sensation to touch Psych:  normal insight, alert and oriented MSK:  Left foot:  TTP of the navicular  Plantar aspect with swelling at the plantar fascia origin  Normal ankle AROM  No ulcer or callus formation  neurovascularly intact       ASSESSMENT & PLAN:   Left foot pain Has ongoing pain in the medial mifoot which is likely associated with the  accessory navicular bone. Pain in the plantar aspect is less clear. Could be hematoma that is irritated with ambulation.  - will try a rolling knee scooter  - continue icing and topical and compression  - f/u before her trip. Consider [place] and/or injection of the PF at that time.

## 2018-01-12 NOTE — Assessment & Plan Note (Signed)
Has ongoing pain in the medial mifoot which is likely associated with the accessory navicular bone. Pain in the plantar aspect is less clear. Could be hematoma that is irritated with ambulation.  - will try a rolling knee scooter  - continue icing and topical and compression  - f/u before her trip. Consider US and/or injection of the PF at that time.

## 2018-01-12 NOTE — Patient Instructions (Signed)
Please use the rolling scooter to get off of your feet Please try to ice your foot  Please try to see me before you leave on your trip

## 2018-01-24 NOTE — Progress Notes (Signed)
Kristine [name] - 27 y.o. female MRN [id number]  Date of birth: 02-25-1991  SUBJECTIVE:  Including CC & ROS.  Chief Complaint  Patient presents with  . Follow-up    Kristine [name] is a 27 y.o. female that is here today for left foot pain follow up. Pain has improved. She used the crutches for one week. She has been applying ice and taking motrin as needed. She has been walking without the brace at home. She reports some tightness with dorsiflexion and plantar extension. Denies swelling.     Review of Systems  Constitutional: Negative for fever.  HENT: Negative for congestion.   Respiratory: Negative for cough.   Cardiovascular: Negative for chest pain.  Gastrointestinal: Negative for abdominal pain.  Musculoskeletal: Negative for gait problem.  Skin: Negative for color change.  Neurological: Negative for weakness.  Hematological: Negative for adenopathy.  Psychiatric/Behavioral: Negative for agitation.    HISTORY: Past Medical, Surgical, Social, and Family History Reviewed & Updated per EMR.   Pertinent Historical Findings include:  Past Medical History:  Diagnosis Date  . Anemia   . S/P cesarean section 01/06/2016  . STD (sexually transmitted disease) 03/2014   Chlamydia    Past Surgical History:  Procedure Laterality Date  . CESAREAN SECTION N/A 01/06/2016   Procedure: CESAREAN SECTION;  Surgeon: Sherian ReinJody Bovard-Stuckert, MD;  Location: [facility];  Service: Obstetrics;  Laterality: N/A;  . WISDOM TOOTH EXTRACTION      Allergies  Allergen Reactions  . Bactrim [Sulfamethoxazole-Trimethoprim] Rash    Family History  Problem Relation Age of Onset  . Multiple sclerosis Father   . Hypertension Mother   . Heart disease Maternal Grandmother      Social History   Socioeconomic History  . Marital status: Single    Spouse name: Not on file  . Number of children: Not on file  . Years of education: Not on file  . Highest education level: Not on file  Occupational  History  . Not on file  Social Needs  . Financial resource strain: Not on file  . Food insecurity:    Worry: Not on file    Inability: Not on file  . Transportation needs:    Medical: Not on file    Non-medical: Not on file  Tobacco Use  . Smoking status: Never Smoker  . Smokeless tobacco: Never Used  Substance and Sexual Activity  . Alcohol use: Yes    Alcohol/week: 1.2 oz    Types: 2 Standard drinks or equivalent per week  . Drug use: No  . Sexual activity: Yes    Partners: Male    Birth control/protection: Condom, Implant  Lifestyle  . Physical activity:    Days per week: Not on file    Minutes per session: Not on file  . Stress: Not on file  Relationships  . Social connections:    Talks on phone: Not on file    Gets together: Not on file    Attends religious service: Not on file    Active member of club or organization: Not on file    Attends meetings of clubs or organizations: Not on file    Relationship status: Not on file  . Intimate partner violence:    Fear of current or ex partner: Not on file    Emotionally abused: Not on file    Physically abused: Not on file    Forced sexual activity: Not on file  Other Topics Concern  . Not on file  Social History Narrative  . Not on file     PHYSICAL EXAM:  VS: BP 125/68 (BP Location: Left Arm, Patient Position: Sitting, Cuff Size: Normal)   Pulse 88   Ht 5\' 1"  (1.549 m)   SpO2 99%   BMI 31.93 kg/m  Physical Exam Gen: NAD, alert, cooperative with exam, well-appearing ENT: normal lips, normal nasal mucosa,  Eye: normal EOM, normal conjunctiva and lids CV:  no edema, +2 pedal pulses   Resp: no accessory muscle use, non-labored,  Skin: no rashes, no areas of induration  Neuro: normal tone, normal sensation to touch Psych:  normal insight, alert and oriented MSK:  Left foot:  Mild TTP of the navicular  Improvement of the swelling at the calcaneous  Mild TTP over the medial calcaneous  Normal Ankle ROM    Neurovascularly intact      ASSESSMENT & PLAN:   Left foot pain Has improvement of her symptoms. Still using brace. Used crutches with improvement. - counseled on HEP  - can follow up after her trip. If still having pain then would consider PT or MRI

## 2018-01-25 ENCOUNTER — Encounter: Payer: Self-pay | Admitting: Family Medicine

## 2018-01-25 ENCOUNTER — Ambulatory Visit: Payer: BLUE CROSS/BLUE SHIELD | Admitting: Family Medicine

## 2018-01-25 DIAGNOSIS — M79672 Pain in left foot: Secondary | ICD-10-CM | POA: Diagnosis not present

## 2018-01-25 NOTE — Assessment & Plan Note (Signed)
Has improvement of her symptoms. Still using brace. Used crutches with improvement. - counseled on HEP  - can follow up after her trip. If still having pain then would consider PT or MRI

## 2018-01-25 NOTE — Patient Instructions (Signed)
Good to see you  Please continue the brace You may want to take the crutches with you on your trip  Try the exercises if the pain is less than a 2/10  Please follow up after your trip if your pain is not getting better

## 2018-01-31 ENCOUNTER — Ambulatory Visit: Payer: Self-pay | Admitting: Nurse Practitioner

## 2018-01-31 NOTE — Telephone Encounter (Signed)
Pt calling to see if Dr Leonie DouglasScmitz will order pt a boot to wear for her foot pain (left ankle and foot). 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  Pt states that the ankle feels like a sprain and she is having bottom of hell pain. Pt states that she has seen Dr Jordan LikesSchmitz twice for issue and so far no dx at wo what is affecting her foot.  Pt stated that she cannot afford to pay another copay and would rather not be seen "since I have been there twice.." Routing note to [employer] [facility] due to pt being tx by Dr Jordan LikesSchmitz. Reason for Disposition . [1] MODERATE pain (e.g., interferes with normal activities, limping) AND [2] present > 3 days    Pt does not want to come for appt. Sending note back to provider. Pt wanting a boot to wear for left foot pain.  Answer Assessment - Initial Assessment Questions 1. ONSET: "When did the pain start?"      1 month ago 2. LOCATION: "Where is the pain located?"      Ankle and bottom foot left foot 3. PAIN: "How bad is the pain?"    (Scale 1-10; or mild, moderate, severe)   -  MILD (1-3): doesn't interfere with normal activities    -  MODERATE (4-7): interferes with normal activities (e.g., work or school) or awakens from sleep, limping    -  SEVERE (8-10): excruciating pain, unable to do any normal activities, unable to walk     Moderate to severe 7-8/10 4. WORK OR EXERCISE: "Has there been any recent work or exercise that involved this part of the body?"      walking 5. CAUSE: "What do you think is causing the foot pain?"     Sprain and swelling - pt doesn't know what is causing the pain has been to doctor twice and per pt doctor doesn't know what is causing it 6. OTHER SYMPTOMS: "Do you have any other symptoms?" (e.g., leg pain, rash, fever, numbness)     Cramp to calf area 7. PREGNANCY: "Is there any chance you are pregnant?" "When was your last menstrual period?"     No- LMP ended last Friday.  Protocols used: FOOT  PAIN-A-AH

## 2018-02-01 DIAGNOSIS — M79672 Pain in left foot: Secondary | ICD-10-CM | POA: Diagnosis not present

## 2018-02-01 NOTE — Telephone Encounter (Signed)
Left patient a message to return call

## 2018-02-01 NOTE — Telephone Encounter (Signed)
Spoke with patient-will come by office to pick up boot for her ankle

## 2018-04-07 DIAGNOSIS — R87610 Atypical squamous cells of undetermined significance on cytologic smear of cervix (ASC-US): Secondary | ICD-10-CM | POA: Diagnosis not present

## 2018-04-07 DIAGNOSIS — Z124 Encounter for screening for malignant neoplasm of cervix: Secondary | ICD-10-CM | POA: Diagnosis not present

## 2018-04-07 DIAGNOSIS — Z113 Encounter for screening for infections with a predominantly sexual mode of transmission: Secondary | ICD-10-CM | POA: Diagnosis not present

## 2018-04-07 DIAGNOSIS — Z6832 Body mass index (BMI) 32.0-32.9, adult: Secondary | ICD-10-CM | POA: Diagnosis not present

## 2018-04-07 DIAGNOSIS — Z130 Encounter for screening for diseases of the blood and blood-forming organs and certain disorders involving the immune mechanism: Secondary | ICD-10-CM | POA: Diagnosis not present

## 2018-04-07 DIAGNOSIS — Z01419 Encounter for gynecological examination (general) (routine) without abnormal findings: Secondary | ICD-10-CM | POA: Diagnosis not present

## 2018-04-07 DIAGNOSIS — Z1389 Encounter for screening for other disorder: Secondary | ICD-10-CM | POA: Diagnosis not present

## 2018-04-11 DIAGNOSIS — M25572 Pain in left ankle and joints of left foot: Secondary | ICD-10-CM | POA: Diagnosis not present

## 2018-04-11 DIAGNOSIS — M79672 Pain in left foot: Secondary | ICD-10-CM | POA: Diagnosis not present

## 2018-05-02 DIAGNOSIS — M79672 Pain in left foot: Secondary | ICD-10-CM | POA: Diagnosis not present

## 2018-05-02 DIAGNOSIS — M25572 Pain in left ankle and joints of left foot: Secondary | ICD-10-CM | POA: Diagnosis not present

## 2018-05-31 DIAGNOSIS — M25572 Pain in left ankle and joints of left foot: Secondary | ICD-10-CM | POA: Diagnosis not present

## 2018-05-31 DIAGNOSIS — R87619 Unspecified abnormal cytological findings in specimens from cervix uteri: Secondary | ICD-10-CM | POA: Diagnosis not present

## 2018-05-31 DIAGNOSIS — M79672 Pain in left foot: Secondary | ICD-10-CM | POA: Diagnosis not present

## 2018-05-31 DIAGNOSIS — N870 Mild cervical dysplasia: Secondary | ICD-10-CM | POA: Diagnosis not present

## 2018-06-09 DIAGNOSIS — M25572 Pain in left ankle and joints of left foot: Secondary | ICD-10-CM | POA: Diagnosis not present

## 2018-06-17 DIAGNOSIS — M25572 Pain in left ankle and joints of left foot: Secondary | ICD-10-CM | POA: Diagnosis not present

## 2018-06-17 DIAGNOSIS — M79672 Pain in left foot: Secondary | ICD-10-CM | POA: Diagnosis not present

## 2018-07-06 DIAGNOSIS — M79672 Pain in left foot: Secondary | ICD-10-CM | POA: Diagnosis not present

## 2018-07-06 DIAGNOSIS — Q742 Other congenital malformations of lower limb(s), including pelvic girdle: Secondary | ICD-10-CM | POA: Diagnosis not present

## 2018-07-06 DIAGNOSIS — M25572 Pain in left ankle and joints of left foot: Secondary | ICD-10-CM | POA: Diagnosis not present

## 2018-07-07 ENCOUNTER — Other Ambulatory Visit (HOSPITAL_COMMUNITY): Payer: Self-pay | Admitting: Orthopedic Surgery

## 2018-08-08 DIAGNOSIS — S0502XA Injury of conjunctiva and corneal abrasion without foreign body, left eye, initial encounter: Secondary | ICD-10-CM | POA: Diagnosis not present

## 2018-08-10 ENCOUNTER — Encounter (HOSPITAL_BASED_OUTPATIENT_CLINIC_OR_DEPARTMENT_OTHER): Payer: Self-pay | Admitting: *Deleted

## 2018-08-10 ENCOUNTER — Other Ambulatory Visit: Payer: Self-pay

## 2018-08-18 ENCOUNTER — Ambulatory Visit (HOSPITAL_BASED_OUTPATIENT_CLINIC_OR_DEPARTMENT_OTHER): Payer: 59 | Admitting: Anesthesiology

## 2018-08-18 ENCOUNTER — Ambulatory Visit (HOSPITAL_BASED_OUTPATIENT_CLINIC_OR_DEPARTMENT_OTHER)
Admission: RE | Admit: 2018-08-18 | Discharge: 2018-08-18 | Disposition: A | Discharge status: Home / Self Care | Payer: 59 | Source: Ambulatory Visit | Attending: Orthopedic Surgery | Admitting: Orthopedic Surgery

## 2018-08-18 ENCOUNTER — Encounter (HOSPITAL_BASED_OUTPATIENT_CLINIC_OR_DEPARTMENT_OTHER): Payer: Self-pay | Admitting: *Deleted

## 2018-08-18 ENCOUNTER — Other Ambulatory Visit: Payer: Self-pay

## 2018-08-18 ENCOUNTER — Encounter (HOSPITAL_BASED_OUTPATIENT_CLINIC_OR_DEPARTMENT_OTHER)
Admission: RE | Disposition: A | Discharge status: Home / Self Care | Payer: Self-pay | Source: Ambulatory Visit | Attending: Orthopedic Surgery

## 2018-08-18 DIAGNOSIS — M6702 Short Achilles tendon (acquired), left ankle: Secondary | ICD-10-CM | POA: Insufficient documentation

## 2018-08-18 DIAGNOSIS — M62462 Contracture of muscle, left lower leg: Secondary | ICD-10-CM | POA: Diagnosis not present

## 2018-08-18 DIAGNOSIS — Q742 Other congenital malformations of lower limb(s), including pelvic girdle: Secondary | ICD-10-CM | POA: Diagnosis not present

## 2018-08-18 DIAGNOSIS — G8918 Other acute postprocedural pain: Secondary | ICD-10-CM | POA: Diagnosis not present

## 2018-08-18 DIAGNOSIS — M67874 Other specified disorders of tendon, left ankle and foot: Secondary | ICD-10-CM | POA: Diagnosis not present

## 2018-08-18 HISTORY — DX: Methicillin resistant Staphylococcus aureus infection, unspecified site: A49.02

## 2018-08-18 HISTORY — PX: TENDON TRANSFER: SHX6109

## 2018-08-18 HISTORY — PX: BONE EXCISION: SHX6730

## 2018-08-18 HISTORY — PX: GASTROCNEMIUS RECESSION: SHX863

## 2018-08-18 SURGERY — RECESSION, MUSCLE, GASTROCNEMIUS
Anesthesia: General | Site: Leg Lower | Laterality: Left

## 2018-08-18 MED ORDER — ONDANSETRON HCL 4 MG/2ML IJ SOLN
INTRAMUSCULAR | Status: AC
  Filled 2018-08-18: qty 2

## 2018-08-18 MED ORDER — CHLORHEXIDINE GLUCONATE 4 % EX LIQD: 60.0000 mL | Freq: Once | CUTANEOUS | Status: DC

## 2018-08-18 MED ORDER — DEXAMETHASONE SODIUM PHOSPHATE 4 MG/ML IJ SOLN
INTRAMUSCULAR | Status: DC | PRN
  Administered 2018-08-18: 10 mg via INTRAVENOUS

## 2018-08-18 MED ORDER — OXYCODONE HCL 5 MG PO TABS: 5.0000 mg | ORAL_TABLET | Freq: Four times a day (QID) | ORAL | 0 refills | Status: AC | PRN

## 2018-08-18 MED ORDER — FENTANYL CITRATE (PF) 100 MCG/2ML IJ SOLN
INTRAMUSCULAR | Status: AC
  Filled 2018-08-18: qty 2

## 2018-08-18 MED ORDER — MIDAZOLAM HCL 2 MG/2ML IJ SOLN
INTRAMUSCULAR | Status: AC
  Filled 2018-08-18: qty 2

## 2018-08-18 MED ORDER — CEFAZOLIN SODIUM-DEXTROSE 2-4 GM/100ML-% IV SOLN
2.0000 g | INTRAVENOUS | Status: AC
  Administered 2018-08-18: 2 g via INTRAVENOUS

## 2018-08-18 MED ORDER — LIDOCAINE 2% (20 MG/ML) 5 ML SYRINGE
INTRAMUSCULAR | Status: DC | PRN
  Administered 2018-08-18: 50 mg via INTRAVENOUS

## 2018-08-18 MED ORDER — ROPIVACAINE HCL 5 MG/ML IJ SOLN
INTRAMUSCULAR | Status: DC | PRN
  Administered 2018-08-18: 30 mL via PERINEURAL

## 2018-08-18 MED ORDER — LACTATED RINGERS IV SOLN
INTRAVENOUS | Status: DC
  Administered 2018-08-18 (×2): via INTRAVENOUS

## 2018-08-18 MED ORDER — SCOPOLAMINE 1 MG/3DAYS TD PT72: 1.0000 | MEDICATED_PATCH | Freq: Once | TRANSDERMAL | Status: DC | PRN

## 2018-08-18 MED ORDER — CEFAZOLIN SODIUM-DEXTROSE 2-4 GM/100ML-% IV SOLN
INTRAVENOUS | Status: AC
  Filled 2018-08-18: qty 100

## 2018-08-18 MED ORDER — MEPERIDINE HCL 25 MG/ML IJ SOLN: 6.2500 mg | INTRAMUSCULAR | Status: DC | PRN

## 2018-08-18 MED ORDER — OXYCODONE HCL 5 MG PO TABS: 5.0000 mg | ORAL_TABLET | Freq: Once | ORAL | Status: DC | PRN

## 2018-08-18 MED ORDER — SODIUM CHLORIDE 0.9 % IV SOLN: INTRAVENOUS | Status: DC

## 2018-08-18 MED ORDER — FENTANYL CITRATE (PF) 100 MCG/2ML IJ SOLN
50.0000 ug | INTRAMUSCULAR | Status: AC | PRN
  Administered 2018-08-18: 100 ug via INTRAVENOUS
  Administered 2018-08-18 (×2): 50 ug via INTRAVENOUS

## 2018-08-18 MED ORDER — LIDOCAINE 2% (20 MG/ML) 5 ML SYRINGE
INTRAMUSCULAR | Status: AC
  Filled 2018-08-18: qty 5

## 2018-08-18 MED ORDER — ONDANSETRON 4 MG PO TBDP
4.0000 mg | ORAL_TABLET | Freq: Once | ORAL | Status: AC
  Administered 2018-08-18: 4 mg via ORAL

## 2018-08-18 MED ORDER — ENOXAPARIN SODIUM 40 MG/0.4ML ~~LOC~~ SOLN: 40.0000 mg | SUBCUTANEOUS | 0 refills | Status: DC

## 2018-08-18 MED ORDER — DEXAMETHASONE SODIUM PHOSPHATE 10 MG/ML IJ SOLN
INTRAMUSCULAR | Status: AC
  Filled 2018-08-18: qty 1

## 2018-08-18 MED ORDER — PROPOFOL 10 MG/ML IV BOLUS
INTRAVENOUS | Status: DC | PRN
  Administered 2018-08-18: 200 mg via INTRAVENOUS

## 2018-08-18 MED ORDER — HYDROMORPHONE HCL 1 MG/ML IJ SOLN
INTRAMUSCULAR | Status: AC
  Filled 2018-08-18: qty 0.5

## 2018-08-18 MED ORDER — ONDANSETRON HCL 4 MG/2ML IJ SOLN
INTRAMUSCULAR | Status: DC | PRN
  Administered 2018-08-18: 4 mg via INTRAVENOUS

## 2018-08-18 MED ORDER — 0.9 % SODIUM CHLORIDE (POUR BTL) OPTIME
TOPICAL | Status: DC | PRN
  Administered 2018-08-18: 300 mL

## 2018-08-18 MED ORDER — HYDROMORPHONE HCL 1 MG/ML IJ SOLN
0.2500 mg | INTRAMUSCULAR | Status: DC | PRN
  Administered 2018-08-18 (×2): 0.5 mg via INTRAVENOUS

## 2018-08-18 MED ORDER — PROMETHAZINE HCL 25 MG/ML IJ SOLN: 6.2500 mg | INTRAMUSCULAR | Status: DC | PRN

## 2018-08-18 MED ORDER — MIDAZOLAM HCL 2 MG/2ML IJ SOLN
1.0000 mg | INTRAMUSCULAR | Status: DC | PRN
  Administered 2018-08-18 (×2): 2 mg via INTRAVENOUS

## 2018-08-18 MED ORDER — OXYCODONE HCL 5 MG/5ML PO SOLN: 5.0000 mg | Freq: Once | ORAL | Status: DC | PRN

## 2018-08-18 MED ORDER — ONDANSETRON 4 MG PO TBDP
ORAL_TABLET | ORAL | Status: AC
  Filled 2018-08-18: qty 1

## 2018-08-18 SURGICAL SUPPLY — 79 items
ANCHOR JUGGERKNOT WTAP NDL 2.9 (Anchor) ×3 IMPLANT
BANDAGE ACE 4X5 VEL STRL LF (GAUZE/BANDAGES/DRESSINGS) IMPLANT
BANDAGE ESMARK 6X9 LF (GAUZE/BANDAGES/DRESSINGS) IMPLANT
BIT DRILL JUGRKNT W/NDL BIT2.9 (DRILL) ×2 IMPLANT
BLADE ARTHRO LOK 4 BEAVER (BLADE) IMPLANT
BLADE AVERAGE 25X9 (BLADE) IMPLANT
BLADE OSC/SAG .038X5.5 CUT EDG (BLADE) IMPLANT
BLADE SURG 15 STRL LF DISP TIS (BLADE) ×8 IMPLANT
BLADE SURG 15 STRL SS (BLADE) ×4
BNDG COHESIVE 4X5 TAN STRL (GAUZE/BANDAGES/DRESSINGS) IMPLANT
BNDG COHESIVE 6X5 TAN STRL LF (GAUZE/BANDAGES/DRESSINGS) IMPLANT
BNDG CONFORM 2 STRL LF (GAUZE/BANDAGES/DRESSINGS) IMPLANT
BNDG ESMARK 6X9 LF (GAUZE/BANDAGES/DRESSINGS)
BOOT STEPPER DURA LG (SOFTGOODS) IMPLANT
BOOT STEPPER DURA MED (SOFTGOODS) IMPLANT
BOOT STEPPER DURA XLG (SOFTGOODS) IMPLANT
CHLORAPREP W/TINT 26ML (MISCELLANEOUS) ×3 IMPLANT
COVER BACK TABLE 60X90IN (DRAPES) ×3 IMPLANT
COVER WAND RF STERILE (DRAPES) IMPLANT
CUFF TOURNIQUET SINGLE 34IN LL (TOURNIQUET CUFF) ×3 IMPLANT
DECANTER SPIKE VIAL GLASS SM (MISCELLANEOUS) IMPLANT
DRAPE EXTREMITY T 121X128X90 (DISPOSABLE) ×3 IMPLANT
DRAPE OEC MINIVIEW 54X84 (DRAPES) IMPLANT
DRAPE U-SHAPE 47X51 STRL (DRAPES) ×3 IMPLANT
DRILL JUGGERKNOT W/NDL BIT 2.9 (DRILL) ×3
DRSG MEPITEL 4X7.2 (GAUZE/BANDAGES/DRESSINGS) ×3 IMPLANT
DRSG PAD ABDOMINAL 8X10 ST (GAUZE/BANDAGES/DRESSINGS) ×3 IMPLANT
ELECT REM PT RETURN 9FT ADLT (ELECTROSURGICAL) ×3
ELECTRODE REM PT RTRN 9FT ADLT (ELECTROSURGICAL) ×2 IMPLANT
GAUZE SPONGE 4X4 12PLY STRL (GAUZE/BANDAGES/DRESSINGS) ×3 IMPLANT
GLOVE BIO SURGEON STRL SZ8 (GLOVE) ×3 IMPLANT
GLOVE BIOGEL PI IND STRL 7.0 (GLOVE) ×4 IMPLANT
GLOVE BIOGEL PI IND STRL 8 (GLOVE) ×6 IMPLANT
GLOVE BIOGEL PI INDICATOR 7.0 (GLOVE) ×2
GLOVE BIOGEL PI INDICATOR 8 (GLOVE) ×3
GLOVE ECLIPSE 8.0 STRL XLNG CF (GLOVE) ×3 IMPLANT
GLOVE SURG SS PI 6.5 STRL IVOR (GLOVE) ×3 IMPLANT
GLOVE SURG SYN 8.0 (GLOVE) ×3 IMPLANT
GOWN STRL REIN XL XLG (GOWN DISPOSABLE) ×3 IMPLANT
GOWN STRL REUS W/ TWL LRG LVL3 (GOWN DISPOSABLE) ×2 IMPLANT
GOWN STRL REUS W/ TWL XL LVL3 (GOWN DISPOSABLE) ×2 IMPLANT
GOWN STRL REUS W/TWL LRG LVL3 (GOWN DISPOSABLE) ×1
GOWN STRL REUS W/TWL XL LVL3 (GOWN DISPOSABLE) ×1
NDL SUT 6 .5 CRC .975X.05 MAYO (NEEDLE) IMPLANT
NEEDLE HYPO 22GX1.5 SAFETY (NEEDLE) IMPLANT
NEEDLE HYPO 25X1 1.5 SAFETY (NEEDLE) IMPLANT
NEEDLE MAYO TAPER (NEEDLE)
NS IRRIG 1000ML POUR BTL (IV SOLUTION) ×3 IMPLANT
PACK BASIN DAY SURGERY FS (CUSTOM PROCEDURE TRAY) ×3 IMPLANT
PAD CAST 4YDX4 CTTN HI CHSV (CAST SUPPLIES) ×2 IMPLANT
PADDING CAST ABS 4INX4YD NS (CAST SUPPLIES)
PADDING CAST ABS COTTON 4X4 ST (CAST SUPPLIES) IMPLANT
PADDING CAST COTTON 4X4 STRL (CAST SUPPLIES) ×1
PADDING CAST COTTON 6X4 STRL (CAST SUPPLIES) IMPLANT
PASSER SUT SWANSON 36MM LOOP (INSTRUMENTS) IMPLANT
PENCIL BUTTON HOLSTER BLD 10FT (ELECTRODE) ×3 IMPLANT
RETRIEVER SUT HEWSON (MISCELLANEOUS) IMPLANT
SANITIZER HAND PURELL 535ML FO (MISCELLANEOUS) ×3 IMPLANT
SHEET MEDIUM DRAPE 40X70 STRL (DRAPES) IMPLANT
SPLINT FAST PLASTER 5X30 (CAST SUPPLIES)
SPLINT PLASTER CAST FAST 5X30 (CAST SUPPLIES) IMPLANT
SPONGE LAP 18X18 RF (DISPOSABLE) ×3 IMPLANT
STAPLER VISISTAT 35W (STAPLE) IMPLANT
STOCKINETTE 6  STRL (DRAPES) ×1
STOCKINETTE 6 STRL (DRAPES) ×2 IMPLANT
SUCTION FRAZIER HANDLE 10FR (MISCELLANEOUS) ×1
SUCTION TUBE FRAZIER 10FR DISP (MISCELLANEOUS) ×2 IMPLANT
SUT ETHILON 3 0 PS 1 (SUTURE) ×6 IMPLANT
SUT MERSILENE 2.0 SH NDLE (SUTURE) IMPLANT
SUT MNCRL AB 3-0 PS2 18 (SUTURE) ×3 IMPLANT
SUT VIC AB 0 SH 27 (SUTURE) ×3 IMPLANT
SUT VIC AB 2-0 SH 27 (SUTURE)
SUT VIC AB 2-0 SH 27XBRD (SUTURE) IMPLANT
SYR BULB 3OZ (MISCELLANEOUS) ×3 IMPLANT
SYR CONTROL 10ML LL (SYRINGE) IMPLANT
TOWEL GREEN STERILE FF (TOWEL DISPOSABLE) ×6 IMPLANT
TUBE CONNECTING 20X1/4 (TUBING) ×3 IMPLANT
UNDERPAD 30X30 (UNDERPADS AND DIAPERS) ×3 IMPLANT
YANKAUER SUCT BULB TIP NO VENT (SUCTIONS) IMPLANT

## 2018-08-18 NOTE — Transfer of Care (Signed)
Immediate Anesthesia Transfer of Care Note  Patient: Kristine [name]  Procedure(s) Performed: Left gastroc recession (Left Leg Lower) Excision of Accessory Navicular (Left Foot) Transfer of Posterior Tibial Tendon (Left Foot)  Patient Location: PACU  Anesthesia Type:General and Regional  Level of Consciousness: awake and sedated  Airway & Oxygen Therapy: Patient Spontanous Breathing and Patient connected to face mask oxygen  Post-op Assessment: Report given to RN and Post -op Vital signs reviewed and stable  Post vital signs: Reviewed and stable  Last Vitals:  Vitals Value Taken Time  BP 130/87 08/18/2018  2:45 PM  Temp    Pulse 109 08/18/2018  2:45 PM  Resp 11 08/18/2018  2:45 PM  SpO2 100 % 08/18/2018  2:45 PM  Vitals shown include unvalidated device data.  Last Pain:  Vitals:   08/18/18 1043  TempSrc: Oral  PainSc: 8       Patients Stated Pain Goal: 3 (08/18/18 1043)  Complications: No apparent anesthesia complications

## 2018-08-18 NOTE — Anesthesia Procedure Notes (Signed)
Anesthesia Regional Block: Popliteal block   Pre-Anesthetic Checklist: ,, timeout performed, Correct Patient, Correct Site, Correct Laterality, Correct Procedure, Correct Position, site marked, Risks and benefits discussed,  Surgical consent,  Pre-op evaluation,  At surgeon's request and post-op pain management  Laterality: Left  Prep: chloraprep       Needles:  Injection technique: Single-shot  Needle Type: Stimiplex     Needle Length: 9cm  Needle Gauge: 21     Additional Needles:   Procedures:,,,, ultrasound used (permanent image in chart),,,,  Narrative:  Start time: 08/18/2018 11:41 AM End time: 08/18/2018 11:46 AM Injection made incrementally with aspirations every 5 mL.  Performed by: Personally  Anesthesiologist: Lowella Curb, MD

## 2018-08-18 NOTE — Progress Notes (Signed)
Assisted Dr. Miller with left, ultrasound guided, popliteal block. Side rails up, monitors on throughout procedure. See vital signs in flow sheet. Tolerated Procedure well. 

## 2018-08-18 NOTE — H&P (Signed)
Kristine [name] is an 28 y.o. female.   Chief Complaint: Left ankle pain HPI: The patient is a 28 year old female with left ankle pain due to a short Achilles tendon and painful accessory navicular.  She presents now for gastrocnemius recession and excision of the accessory navicular with advancement of the posterior tibial tendon.  She has failed nonoperative treatment to date including activity modification, oral anti-inflammatories, bracing and therapy.  Past Medical History:  Diagnosis Date  . MRSA infection 2017   right upper thigh  . S/P cesarean section 01/06/2016  . STD (sexually transmitted disease) 03/2014   Chlamydia    Past Surgical History:  Procedure Laterality Date  . CESAREAN SECTION N/A 01/06/2016   Procedure: CESAREAN SECTION;  Surgeon: Sherian Rein, MD;  Location: [facility];  Service: Obstetrics;  Laterality: N/A;  . WISDOM TOOTH EXTRACTION      Family History  Problem Relation Age of Onset  . Multiple sclerosis Father   . Hypertension Mother   . Heart disease Maternal Grandmother    Social History:  reports that she has never smoked. She has never used smokeless tobacco. She reports current alcohol use of about 2.0 standard drinks of alcohol per week. She reports that she does not use drugs.  Allergies:  Allergies  Allergen Reactions  . Bactrim [Sulfamethoxazole-Trimethoprim] Rash    Medications Prior to Admission  Medication Sig Dispense Refill  . Diclofenac Sodium (PENNSAID) 2 % SOLN Place 1 application onto the skin 2 (two) times daily. 1 Bottle 3  . levonorgestrel (MIRENA, 52 MG,) 20 MCG/24HR IUD 1 each by Intrauterine route once.      No results found for this or any previous visit (from the past 48 hour(s)). No results found.  ROS no recent fever, chills, nausea, vomiting or changes in her appetite  Blood pressure 111/75, pulse (!) 55, temperature 98.3 F (36.8 C), temperature source Oral, resp. rate 15, height 5\' 1"  (1.549 m), weight  79.5 kg, last menstrual period 08/17/2018, SpO2 100 %. Physical Exam  Well-nourished well-developed young woman in no apparent distress.  Alert and oriented x4.  Mood and affect are normal.  Extraocular motions are intact.  Respirations are unlabored.  Gait is antalgic to the left.  The left ankle is tender to palpation at the medial navicular.  Skin is healthy and intact.  5 out of 5 strength in plantar flexion and inversion.  No lymphadenopathy.  Sensibility to light touch is intact medially and laterally at the hindfoot.  Assessment/Plan Painful left accessory navicular and short Achilles tendon -to the operating room today for surgical treatment.  The risks and benefits of the alternative treatment options have been discussed in detail.  The patient wishes to proceed with surgery and specifically understands risks of bleeding, infection, nerve damage, blood clots, need for additional surgery, amputation and death.   Toni Arthurs, MD 08-29-2018, 1:16 PM

## 2018-08-18 NOTE — Discharge Instructions (Signed)
Toni ArthursJohn Hewitt, MD [facility]  Please read the following information regarding your care after surgery.  Medications  You only need a prescription for the narcotic pain medicine (ex. oxycodone, Percocet, Norco).  All of the other medicines listed below are available over the counter. X Aleve 2 pills twice a day for the first 3 days after surgery. X acetominophen (Tylenol) 650 mg every 4-6 hours as you need for minor to moderate pain X oxycodone as prescribed for severe pain  Narcotic pain medicine (ex. oxycodone, Percocet, Vicodin) will cause constipation.  To prevent this problem, take the following medicines while you are taking any pain medicine. X docusate sodium (Colace) 100 mg twice a day X senna (Senokot) 2 tablets twice a day  X To help prevent blood clots, take Lovenox injections once a day for two weeks.  Then take a baby aspirin (81 mg) twice a day for a month.  You should also get up every hour while you are awake to move around.    Weight Bearing ? Bear weight when you are able on your operated leg or foot. ? Bear weight only on your operated foot in the post-op shoe. X Do not bear any weight on the operated leg or foot.  Cast / Splint / Dressing X Keep your splint, cast or dressing clean and dry.  Dont put anything (coat hanger, pencil, etc) down inside of it.  If it gets damp, use a hair dryer on the cool setting to dry it.  If it gets soaked, call the office to schedule an appointment for a cast change. ? Remove your dressing 3 days after surgery and cover the incisions with dry dressings.    After your dressing, cast or splint is removed; you may shower, but do not soak or scrub the wound.  Allow the water to run over it, and then gently pat it dry.  Swelling It is normal for you to have swelling where you had surgery.  To reduce swelling and pain, keep your toes above your nose for at least 3 days after surgery.  It may be necessary to keep your foot or leg  elevated for several weeks.  If it hurts, it should be elevated.  Follow Up Call my office at [phone] when you are discharged from the hospital or surgery center to schedule an appointment to be seen two weeks after surgery.  Call my office at [phone] if you develop a fever >101.5 F, nausea, vomiting, bleeding from the surgical site or severe pain.        Regional Anesthesia Blocks  1. Numbness or the inability to move the "blocked" extremity may last from 3-48 hours after placement. The length of time depends on the medication injected and your individual response to the medication. If the numbness is not going away after 48 hours, call your surgeon.  2. The extremity that is blocked will need to be protected until the numbness is gone and the  Strength has returned. Because you cannot feel it, you will need to take extra care to avoid injury. Because it may be weak, you may have difficulty moving it or using it. You may not know what position it is in without looking at it while the block is in effect.  3. For blocks in the legs and feet, returning to weight bearing and walking needs to be done carefully. You will need to wait until the numbness is entirely gone and the strength has returned. You should  be able to move your leg and foot normally before you try and bear weight or walk. You will need someone to be with you when you first try to ensure you do not fall and possibly risk injury.  4. Bruising and tenderness at the needle site are common side effects and will resolve in a few days.  5. Persistent numbness or new problems with movement should be communicated to the surgeon or the [facility] [phone] [facility] [phone]).        Post Anesthesia Home Care Instructions  Activity: Get plenty of rest for the remainder of the day. A responsible individual must stay with you for 24 hours following the procedure.  For the next 24  hours, DO NOT: -Drive a car -Advertising copywriter -Drink alcoholic beverages -Take any medication unless instructed by your physician -Make any legal decisions or sign important papers.  Meals: Start with liquid foods such as gelatin or soup. Progress to regular foods as tolerated. Avoid greasy, spicy, heavy foods. If nausea and/or vomiting occur, drink only clear liquids until the nausea and/or vomiting subsides. Call your physician if vomiting continues.  Special Instructions/Symptoms: Your throat may feel dry or sore from the anesthesia or the breathing tube placed in your throat during surgery. If this causes discomfort, gargle with warm salt water. The discomfort should disappear within 24 hours.  If you had a scopolamine patch placed behind your ear for the management of post- operative nausea and/or vomiting:  1. The medication in the patch is effective for 72 hours, after which it should be removed.  Wrap patch in a tissue and discard in the trash. Wash hands thoroughly with soap and water. 2. You may remove the patch earlier than 72 hours if you experience unpleasant side effects which may include dry mouth, dizziness or visual disturbances. 3. Avoid touching the patch. Wash your hands with soap and water after contact with the patch.

## 2018-08-18 NOTE — Op Note (Signed)
08/18/2018  2:48 PM  PATIENT:  Kristine KubaEricka Peachey  28 y.o. female  PRE-OPERATIVE DIAGNOSIS: 1.  Painful left accessory navicular      2.  Short left achilles tendon  POST-OPERATIVE DIAGNOSIS:  Same  Procedure(s): 1.  Left gastrocnemius recession 2.  Excision of accessory navicular with advancement of posterior tibial tendon  SURGEON:  Toni ArthursJohn Wylodean Shimmel, MD  ASSISTANT: none  ANESTHESIA:   General, regional  EBL:  minimal   TOURNIQUET:   Total Tourniquet Time Documented: Thigh (Left) - 41 minutes Total: Thigh (Left) - 41 minutes  COMPLICATIONS:  None apparent  DISPOSITION:  Extubated, awake and stable to recovery.  INDICATION FOR PROCEDURE: The patient is a 28 year old female with a history of left medial hindfoot pain due to a painful accessory navicular.  She has a short Achilles tendon as well.  She has failed nonoperative treatment to date and presents today for surgical correction of this painful condition.  The risks and benefits of the alternative treatment options have been discussed in detail.  The patient wishes to proceed with surgery and specifically understands risks of bleeding, infection, nerve damage, blood clots, need for additional surgery, amputation and death.  PROCEDURE IN DETAIL: After preoperative consent was obtained and the correct operative site was identified, the patient was brought to the operating room and placed supine on the operating table.  General anesthesia was administered.  Preoperative antibiotics were administered.  A surgical timeout was taken.  The left lower extremity was prepped and draped in standard sterile fashion with a tourniquet around the thigh.  The extremity was elevated and the tourniquet was inflated to 250 mmHg.  A longitudinal incision was then made over the medial calf.  Dissection was carried down through the subcutaneous tissues taking care to protect the saphenous vein and nerve.  Superficial fascia was incised.  The gastrocnemius tendon  was isolated.  It was divided from medial to lateral under direct vision.  The ankle was then dorsiflex 30 degrees with the knee extended.  The wound was irrigated and closed with Monocryl and nylon.  Attention was then turned to the medial hindfoot.  An incision was made along the posterior tibial tendon sheath to the insertion on the navicular.  Dissection was carried down through the subcutaneous tissues.  The tendon sheath was incised and elevated dorsally and plantarly exposing the medial navicular and posterior tibial tendon.  The tendon itself appeared generally healthy with no evidence of tearing or significant degenerative changes.  The accessory navicular was identified.  It was dissected free from the surrounding tissues with subperiosteal dissection.  It was then removed in its entirety.  The navicular side of the synchondrosis was then debrided of cortical bone with a rondure.  A juggernaut anchor was then inserted after predrilling.  It was noted to have excellent purchase.  2 horizontal mattress sutures of #1 max braid were placed in the posterior tibial tendon repairing it securely to the debrided bone.  The surrounding periosteum was then repaired over the tendon insertion with 0 Vicryl figure-of-eight sutures.  The tendon sheath was repaired with 0 Vicryl.  Subcutaneous tissues were approximated with 3-0 Monocryl.  The skin incision was closed with 3-0 nylon.  Sterile dressings were applied followed by a well-padded short leg splint.  The tourniquet was released after application of the dressings.  The patient was awakened from anesthesia and transported to the recovery room in stable condition.   FOLLOW UP PLAN: Nonweightbearing on the left lower extremity.  Follow-up in the office in 2 weeks for suture removal and conversion to a short leg cast.  Aspirin for DVT prophylaxis.

## 2018-08-18 NOTE — Anesthesia Preprocedure Evaluation (Signed)
Anesthesia Evaluation  Patient identified by MRN, date of birth, ID band Patient awake    Reviewed: Allergy & Precautions, NPO status , Patient's Chart, lab work & pertinent test results  Airway Mallampati: II  TM Distance: >3 FB Neck ROM: Full    Dental no notable dental hx. (+) Dental Advisory Given   Pulmonary neg pulmonary ROS,    Pulmonary exam normal breath sounds clear to auscultation       Cardiovascular negative cardio ROS Normal cardiovascular exam Rhythm:Regular Rate:Normal     Neuro/Psych negative neurological ROS  negative psych ROS   GI/Hepatic negative GI ROS, Neg liver ROS,   Endo/Other  negative endocrine ROS  Renal/GU negative Renal ROS  negative genitourinary   Musculoskeletal negative musculoskeletal ROS (+)   Abdominal (+) + obese,   Peds negative pediatric ROS (+)  Hematology negative hematology ROS (+) anemia ,   Anesthesia Other Findings   Reproductive/Obstetrics negative OB ROS                             Anesthesia Physical  Anesthesia Plan  ASA: II and emergent  Anesthesia Plan: General   Post-op Pain Management: GA combined w/ Regional for post-op pain   Induction: Intravenous  PONV Risk Score and Plan: 3 and Ondansetron, Dexamethasone and Midazolam  Airway Management Planned: Oral ETT  Additional Equipment:   Intra-op Plan:   Post-operative Plan: Extubation in OR  Informed Consent: I have reviewed the patients History and Physical, chart, labs and discussed the procedure including the risks, benefits and alternatives for the proposed anesthesia with the patient or authorized representative who has indicated his/her understanding and acceptance.     Dental advisory given  Plan Discussed with: Anesthesiologist, CRNA and Surgeon  Anesthesia Plan Comments:         Anesthesia Quick Evaluation

## 2018-08-18 NOTE — Anesthesia Procedure Notes (Signed)
Procedure Name: LMA Insertion Performed by: Keylor Rands W, CRNA Pre-anesthesia Checklist: Patient identified, Emergency Drugs available, Suction available and Patient being monitored Patient Re-evaluated:Patient Re-evaluated prior to induction Oxygen Delivery Method: Circle system utilized Preoxygenation: Pre-oxygenation with 100% oxygen Induction Type: IV induction Ventilation: Mask ventilation without difficulty LMA: LMA inserted LMA Size: 4.0 Number of attempts: 1 Placement Confirmation: positive ETCO2 Tube secured with: Tape Dental Injury: Teeth and Oropharynx as per pre-operative assessment        

## 2018-08-19 ENCOUNTER — Encounter (HOSPITAL_BASED_OUTPATIENT_CLINIC_OR_DEPARTMENT_OTHER): Payer: Self-pay | Admitting: Orthopedic Surgery

## 2018-08-19 NOTE — Anesthesia Postprocedure Evaluation (Signed)
Anesthesia Post Note  Patient: Kristine [name]  Procedure(s) Performed: Left gastroc recession (Left Leg Lower) Excision of Accessory Navicular (Left Foot) Transfer of Posterior Tibial Tendon (Left Foot)     Patient location during evaluation: PACU Anesthesia Type: General Level of consciousness: awake and alert Pain management: pain level controlled Vital Signs Assessment: post-procedure vital signs reviewed and stable Respiratory status: spontaneous breathing, nonlabored ventilation and respiratory function stable Cardiovascular status: blood pressure returned to baseline and stable Postop Assessment: no apparent nausea or vomiting Anesthetic complications: no    Last Vitals:  Vitals:   08/18/18 1545 08/18/18 1615  BP: 111/75 122/73  Pulse: 63 77  Resp: 14 20  Temp:  36.6 C  SpO2: 100% 100%    Last Pain:  Vitals:   08/18/18 1615  TempSrc:   PainSc: 5                  Lowella Curb

## 2018-08-31 DIAGNOSIS — Q742 Other congenital malformations of lower limb(s), including pelvic girdle: Secondary | ICD-10-CM | POA: Diagnosis not present

## 2018-09-29 DIAGNOSIS — M79672 Pain in left foot: Secondary | ICD-10-CM | POA: Diagnosis not present

## 2018-11-07 DIAGNOSIS — M25572 Pain in left ankle and joints of left foot: Secondary | ICD-10-CM | POA: Diagnosis not present

## 2018-11-11 DIAGNOSIS — M25572 Pain in left ankle and joints of left foot: Secondary | ICD-10-CM | POA: Diagnosis not present

## 2018-11-17 DIAGNOSIS — M25572 Pain in left ankle and joints of left foot: Secondary | ICD-10-CM | POA: Diagnosis not present

## 2018-11-21 DIAGNOSIS — M25572 Pain in left ankle and joints of left foot: Secondary | ICD-10-CM | POA: Diagnosis not present

## 2018-11-24 DIAGNOSIS — M25572 Pain in left ankle and joints of left foot: Secondary | ICD-10-CM | POA: Diagnosis not present

## 2018-11-30 DIAGNOSIS — M25572 Pain in left ankle and joints of left foot: Secondary | ICD-10-CM | POA: Diagnosis not present

## 2018-12-01 DIAGNOSIS — M25572 Pain in left ankle and joints of left foot: Secondary | ICD-10-CM | POA: Diagnosis not present

## 2018-12-17 DIAGNOSIS — Z719 Counseling, unspecified: Secondary | ICD-10-CM | POA: Diagnosis not present

## 2019-07-20 ENCOUNTER — Encounter (HOSPITAL_COMMUNITY): Payer: Self-pay | Admitting: Emergency Medicine

## 2019-07-20 ENCOUNTER — Ambulatory Visit (HOSPITAL_COMMUNITY)
Admission: EM | Admit: 2019-07-20 | Discharge: 2019-07-20 | Disposition: A | Discharge status: Home / Self Care | Payer: BC Managed Care – PPO | Attending: Urgent Care | Admitting: Urgent Care

## 2019-07-20 ENCOUNTER — Other Ambulatory Visit: Payer: Self-pay

## 2019-07-20 DIAGNOSIS — Z20822 Contact with and (suspected) exposure to covid-19: Secondary | ICD-10-CM

## 2019-07-20 DIAGNOSIS — R112 Nausea with vomiting, unspecified: Secondary | ICD-10-CM | POA: Insufficient documentation

## 2019-07-20 DIAGNOSIS — R42 Dizziness and giddiness: Secondary | ICD-10-CM | POA: Diagnosis not present

## 2019-07-20 DIAGNOSIS — K529 Noninfective gastroenteritis and colitis, unspecified: Secondary | ICD-10-CM

## 2019-07-20 DIAGNOSIS — Z20828 Contact with and (suspected) exposure to other viral communicable diseases: Secondary | ICD-10-CM | POA: Insufficient documentation

## 2019-07-20 MED ORDER — ONDANSETRON 8 MG PO TBDP: 8.0000 mg | ORAL_TABLET | Freq: Three times a day (TID) | ORAL | 0 refills | Status: DC | PRN

## 2019-07-20 MED ORDER — ONDANSETRON HCL 4 MG/2ML IJ SOLN: 4.0000 mg | Freq: Once | INTRAMUSCULAR | Status: DC

## 2019-07-20 MED ORDER — ONDANSETRON HCL 4 MG/2ML IJ SOLN
INTRAMUSCULAR | Status: AC
  Filled 2019-07-20: qty 2

## 2019-07-20 MED ORDER — SODIUM CHLORIDE 0.9 % IV BOLUS
1000.0000 mL | Freq: Once | INTRAVENOUS | Status: AC
  Administered 2019-07-20: 1000 mL via INTRAVENOUS

## 2019-07-20 MED ORDER — ONDANSETRON HCL 4 MG/2ML IJ SOLN
4.0000 mg | Freq: Once | INTRAMUSCULAR | Status: AC
  Administered 2019-07-20: 4 mg via INTRAVENOUS

## 2019-07-20 NOTE — ED Provider Notes (Signed)
[place]   MRN: [id number] DOB: 09/23/1990  Subjective:   Kristine [name] is a 28 y.o. female presenting for 2-day history of acute onset intractable nausea with vomiting (4 episodes per day) with dry heaving in between the vomiting episodes.  Symptoms started after she ate [employer] on Tuesday.  Has since started to feel dizzy, is having a very difficult time holding any kind of foods or liquids down.  Has tried Excedrin with minimal relief.  Reports low chance of pregnancy given that she has an IUD.  LMP is due this week.  Denies any known COVID-19 contacts.  No current facility-administered medications for this encounter.  Current Outpatient Medications:  .  enoxaparin (LOVENOX) 40 MG/0.4ML injection, Inject 0.4 mLs (40 mg total) into the skin daily., Disp: 14 Syringe, Rfl: 0 .  levonorgestrel (MIRENA, 52 MG,) 20 MCG/24HR IUD, 1 each by Intrauterine route once., Disp: , Rfl:    Allergies  Allergen Reactions  . Bactrim [Sulfamethoxazole-Trimethoprim] Rash    Past Medical History:  Diagnosis Date  . MRSA infection 2017   right upper thigh  . S/P cesarean section 01/06/2016  . STD (sexually transmitted disease) 03/2014   Chlamydia     Past Surgical History:  Procedure Laterality Date  . BONE EXCISION Left 08/18/2018   Procedure: Excision of Accessory Navicular;  Surgeon: Wylene Simmer, MD;  Location: [facility];  Service: Orthopedics;  Laterality: Left;  . CESAREAN SECTION N/A 01/06/2016   Procedure: CESAREAN SECTION;  Surgeon: Janyth Contes, MD;  Location: [facility];  Service: Obstetrics;  Laterality: N/A;  . GASTROCNEMIUS RECESSION Left 08/18/2018   Procedure: Left gastroc recession;  Surgeon: Wylene Simmer, MD;  Location: [facility];  Service: Orthopedics;  Laterality: Left;  . TENDON TRANSFER Left 08/18/2018   Procedure: Transfer of Posterior Tibial Tendon;  Surgeon: Wylene Simmer, MD;  Location: [facility];   Service: Orthopedics;  Laterality: Left;  . WISDOM TOOTH EXTRACTION      Family History  Problem Relation Age of Onset  . Multiple sclerosis Father   . Hypertension Mother   . Heart disease Maternal Grandmother     Social History   Tobacco Use  . Smoking status: Never Smoker  . Smokeless tobacco: Never Used  Substance Use Topics  . Alcohol use: Yes    Alcohol/week: 2.0 standard drinks    Types: 2 Standard drinks or equivalent per week  . Drug use: No    Review of Systems  Constitutional: Negative for fever and malaise/fatigue.  HENT: Negative for congestion, ear pain, sinus pain and sore throat.   Eyes: Negative for discharge and redness.  Respiratory: Negative for cough, hemoptysis, shortness of breath and wheezing.   Cardiovascular: Negative for chest pain.  Gastrointestinal: Positive for nausea and vomiting. Negative for abdominal pain, blood in stool, constipation and diarrhea.  Genitourinary: Negative for dysuria, flank pain and hematuria.  Musculoskeletal: Negative for myalgias.  Skin: Negative for rash.  Neurological: Positive for dizziness. Negative for weakness and headaches.  Psychiatric/Behavioral: Negative for depression and substance abuse.     Objective:   Vitals: BP (!) 128/101 (BP Location: Left Arm)   Pulse 90   Temp 97.8 F (36.6 C) (Oral)   Resp 16   SpO2 100%   Physical Exam Constitutional:      General: She is not in acute distress.    Appearance: Normal appearance. She is well-developed and normal weight. She is not ill-appearing, toxic-appearing or diaphoretic.  HENT:  Head: Normocephalic and atraumatic.     Right Ear: External ear normal.     Left Ear: External ear normal.     Nose: Nose normal.     Mouth/Throat:     Mouth: Mucous membranes are moist.     Pharynx: Oropharynx is clear.  Eyes:     General: No scleral icterus.    Extraocular Movements: Extraocular movements intact.     Pupils: Pupils are equal, round, and reactive  to light.  Cardiovascular:     Rate and Rhythm: Normal rate and regular rhythm.     Heart sounds: Normal heart sounds. No murmur. No friction rub. No gallop.   Pulmonary:     Effort: Pulmonary effort is normal. No respiratory distress.     Breath sounds: Normal breath sounds. No stridor. No wheezing, rhonchi or rales.  Abdominal:     General: Bowel sounds are normal. There is no distension.     Palpations: Abdomen is soft. There is no mass.     Tenderness: There is abdominal tenderness (generalized throughout). There is no right CVA tenderness, left CVA tenderness, guarding or rebound.  Skin:    General: Skin is warm and dry.     Coloration: Skin is not pale.     Findings: No rash.  Neurological:     General: No focal deficit present.     Mental Status: She is alert and oriented to person, place, and time.  Psychiatric:        Mood and Affect: Mood normal.        Behavior: Behavior normal.        Thought Content: Thought content normal.        Judgment: Judgment normal.     Assessment and Plan :   1. Gastroenteritis   2. Nausea and vomiting, intractability of vomiting not specified, unspecified vomiting type   3. Dizziness   4. Exposure to COVID-19 virus     Will manage for gastroenteritis with supportive care.  Recommended patient hydrate well, eat light meals and maintain electrolytes.  Will use Zofran and Imodium for nausea, vomiting. Counseled patient on potential for adverse effects with medications prescribed/recommended today, ER and return-to-clinic precautions discussed, patient verbalized understanding.    Wallis Bamberg, [place] 07/20/19 1123

## 2019-07-20 NOTE — Discharge Instructions (Signed)
Make sure you push fluids drinking mostly water but mix it with Gatorade.  Try to eat light meals including soups, broths and soft foods, fruits.  You may use Zofran for your nausea and vomiting once every 8 hours.  

## 2019-07-20 NOTE — ED Triage Notes (Signed)
Pt here for cold sx onset 2 days associated w/n/v, dizziness  Denies fevers, SOB/dyspnea, abd pain  Pt is currently vomiting in the room.   A&O x4... NAD.Marland Kitchen. ambulatory

## 2019-07-21 LAB — NOVEL CORONAVIRUS, NAA (HOSP ORDER, SEND-OUT TO REF LAB; TAT 18-24 HRS): SARS-CoV-2, NAA: NOT DETECTED

## 2019-07-24 ENCOUNTER — Encounter: Payer: Self-pay | Admitting: Nurse Practitioner

## 2019-07-24 ENCOUNTER — Telehealth (INDEPENDENT_AMBULATORY_CARE_PROVIDER_SITE_OTHER): Payer: BC Managed Care – PPO | Admitting: Nurse Practitioner

## 2019-07-24 VITALS — Temp 101.9°F | Ht 61.0 in | Wt 184.1 lb

## 2019-07-24 DIAGNOSIS — R519 Headache, unspecified: Secondary | ICD-10-CM | POA: Diagnosis not present

## 2019-07-24 DIAGNOSIS — Z8669 Personal history of other diseases of the nervous system and sense organs: Secondary | ICD-10-CM | POA: Insufficient documentation

## 2019-07-24 MED ORDER — PROMETHAZINE-CODEINE 6.25-10 MG/5ML PO SYRP: 5.0000 mL | ORAL_SOLUTION | Freq: Three times a day (TID) | ORAL | 0 refills | Status: DC | PRN

## 2019-07-24 NOTE — Progress Notes (Signed)
Virtual Visit via Video Note  I connected with Kristine [name] on 07/24/19 at  2:00 PM EST by a video enabled telemedicine application and verified that I am speaking with the correct person using two identifiers.  Location: Patient:home Provider:office Participants: patient and provider I discussed the limitations of evaluation and management by telemedicine and the availability of in person appointments. The patient expressed understanding and agreed to proceed.  CC: pt c/o migrains near the tempo and between eyes area--getting worse in past 2 wks (pt stated she normally gets headache 2x a week already) excedrin help a little, also taking Dayquil and Nyquil.  pt c/o of body pain near lower back area--feels like "shin splints" on her lower back--going on since 07/19/19/ pt stated she went to urgent care 07/19/2019 for dizzy and vomit--covid test was negative 07/20/2019. pt mention that hig BP was high (128/101) and she has no have BM since 07/19/19--  History of Present Illness: Headache  This is a new problem. The current episode started in the past 7 days. The problem occurs daily. The problem has been waxing and waning. The pain is located in the occipital, retro-orbital and bilateral region. The pain does not radiate. Quality: pressure. The pain is at a severity of 6/10. Associated symptoms include back pain, dizziness and muscle aches. Pertinent negatives include no abdominal pain, anorexia, blurred vision, drainage, eye pain, fever, hearing loss, insomnia, loss of balance, numbness, phonophobia, scalp tenderness, seizures, sinus pressure, tingling, visual change, weakness or weight loss. Her past medical history is significant for migraine headaches and obesity. There is no history of cancer, cluster headaches, hypertension, immunosuppression, migraines in the family, pseudotumor cerebri, recent head traumas, sinus disease or TMJ.  Back Pain This is a new problem. The current episode started in  the past 7 days. The problem occurs daily. The problem is unchanged. The pain is present in the lumbar spine and thoracic spine. The quality of the pain is described as aching. The pain does not radiate. Stiffness is present all day. Associated symptoms include headaches. Pertinent negatives include no abdominal pain, bladder incontinence, bowel incontinence, chest pain, dysuria, fever, leg pain, numbness, paresis, paresthesias, pelvic pain, perianal numbness, tingling, weakness or weight loss. Risk factors include obesity and lack of exercise. She has tried NSAIDs and analgesics for the symptoms. The treatment provided mild relief.  resolved nausea, vomiting and diarrhea since hospital discharge  reports hx of migraines: Associated with light and noise sensitivity. Relieved by use of excedrin. Negative COVID on 07/20/19.  Denies any known sick contacts.  Reviewed hospital lab results.  Observations/Objective: Physical Exam  Constitutional: She is oriented to person, place, and time. No distress.  Eyes: Conjunctivae and EOM are normal.  Pulmonary/Chest: Effort normal.  Musculoskeletal:     Cervical back: Normal range of motion and neck supple.  Neurological: She is alert and oriented to person, place, and time.  Skin: No rash noted.  Psychiatric: She has a normal mood and affect. Her behavior is normal. Thought content normal.   Assessment and Plan: Evellyn was seen today for headache and back pain.  Diagnoses and all orders for this visit:  Acute nonintractable headache, unspecified headache type -     promethazine-codeine (PHENERGAN WITH CODEINE) 6.25-10 MG/5ML syrup; Take 5 mLs by mouth every 8 (eight) hours as needed for cough.   Follow Up Instructions: See avs   I discussed the assessment and treatment plan with the patient. The patient was provided an opportunity to ask questions and  all were answered. The patient agreed with the plan and demonstrated an understanding of the  instructions.   The patient was advised to call back or seek an in-person evaluation if the symptoms worsen or if the condition fails to improve as anticipated.  Wilfred Lacy, NP

## 2019-07-24 NOTE — Patient Instructions (Addendum)
Your symptoms are more than likely related to influenza. With onset of symptoms over 72hrs ago, symptom management is key to treatment at this time. Maintain adequate oral hydration. Alternate between tylenol 500mg  and ibuprofen 400mg  every 6hrs as needed for pain and fever. Monitor temp and BP daily. Call office if BP>140/80. Go to hospital if symptoms worsen.   General Headache Without Cause A headache is pain or discomfort that is felt around the head or neck area. There are many causes and types of headaches. In some cases, the cause may not be found. Follow these instructions at home: Watch your condition for any changes. Let your doctor know about them. Take these steps to help with your condition: Managing pain   Take over-the-counter and prescription medicines only as told by your doctor.  Lie down in a dark, quiet room when you have a headache.  If told, put ice on your head and neck area: ? Put ice in a plastic bag. ? Place a towel between your skin and the bag. ? Leave the ice on for 20 minutes, 2-3 times per day.  If told, put heat on the affected area. Use the heat source that your doctor recommends, such as a moist heat pack or a heating pad. ? Place a towel between your skin and the heat source. ? Leave the heat on for 20-30 minutes. ? Remove the heat if your skin turns bright red. This is very important if you are unable to feel pain, heat, or cold. You may have a greater risk of getting burned.  Keep lights dim if bright lights bother you or make your headaches worse. Eating and drinking  Eat meals on a regular schedule.  If you drink alcohol: ? Limit how much you use to:  0-1 drink a day for women.  0-2 drinks a day for men. ? Be aware of how much alcohol is in your drink. In the [place] one drink equals one 12 oz bottle of beer (355 mL), one 5 oz glass of wine (148 mL), or one 1 oz glass of hard liquor (44 mL).  Stop drinking caffeine, or reduce how much  caffeine you drink. General instructions   Keep a journal to find out if certain things bring on headaches. For example, write down: ? What you eat and drink. ? How much sleep you get. ? Any change to your diet or medicines.  Get a massage or try other ways to relax.  Limit stress.  Sit up straight. Do not tighten (tense) your muscles.  Do not use any products that contain nicotine or tobacco. This includes cigarettes, e-cigarettes, and chewing tobacco. If you need help quitting, ask your doctor.  Exercise regularly as told by your doctor.  Get enough sleep. This often means 7-9 hours of sleep each night.  Keep all follow-up visits as told by your doctor. This is important. Contact a doctor if:  Your symptoms are not helped by medicine.  You have a headache that feels different than the other headaches.  You feel sick to your stomach (nauseous) or you throw up (vomit).  You have a fever. Get help right away if:  Your headache gets very bad quickly.  Your headache gets worse after a lot of physical activity.  You keep throwing up.  You have a stiff neck.  You have trouble seeing.  You have trouble speaking.  You have pain in the eye or ear.  Your muscles are weak or you lose  muscle control.  You lose your balance or have trouble walking.  You feel like you will pass out (faint) or you pass out.  You are mixed up (confused).  You have a seizure. Summary  A headache is pain or discomfort that is felt around the head or neck area.  There are many causes and types of headaches. In some cases, the cause may not be found.  Keep a journal to help find out what causes your headaches. Watch your condition for any changes. Let your doctor know about them.  Contact a doctor if you have a headache that is different from usual, or if your headache is not helped by medicine.  Get help right away if your headache gets very bad, you throw up, you have trouble seeing,  you lose your balance, or you have a seizure. This information is not intended to replace advice given to you by your health care provider. Make sure you discuss any questions you have with your health care provider. Document Released: 04/21/2008 Document Revised: 01/31/2018 Document Reviewed: 01/31/2018 Elsevier Patient Education  2020 [employer].

## 2019-07-31 ENCOUNTER — Ambulatory Visit: Payer: Self-pay | Admitting: *Deleted

## 2019-07-31 ENCOUNTER — Encounter: Payer: Self-pay | Admitting: Nurse Practitioner

## 2019-07-31 ENCOUNTER — Other Ambulatory Visit: Payer: Self-pay

## 2019-07-31 ENCOUNTER — Telehealth (INDEPENDENT_AMBULATORY_CARE_PROVIDER_SITE_OTHER): Payer: BC Managed Care – PPO | Admitting: Nurse Practitioner

## 2019-07-31 VITALS — HR 120 | Temp 97.1°F | Ht 61.0 in | Wt 179.2 lb

## 2019-07-31 DIAGNOSIS — J0140 Acute pansinusitis, unspecified: Secondary | ICD-10-CM | POA: Diagnosis not present

## 2019-07-31 DIAGNOSIS — G43111 Migraine with aura, intractable, with status migrainosus: Secondary | ICD-10-CM

## 2019-07-31 MED ORDER — SUMATRIPTAN SUCCINATE 25 MG PO TABS: 25.0000 mg | ORAL_TABLET | ORAL | 0 refills | Status: DC | PRN

## 2019-07-31 MED ORDER — PROMETHAZINE HCL 12.5 MG PO TABS: 12.5000 mg | ORAL_TABLET | Freq: Three times a day (TID) | ORAL | 0 refills | Status: DC | PRN

## 2019-07-31 NOTE — Progress Notes (Addendum)
Virtual Visit via Video Note  I connected with Kristine [name] on 08/02/19 at  9:30 AM EST by a video enabled telemedicine application and verified that I am speaking with the correct person using two identifiers.  Location: Patient:home Provider:office Participants: patient and provider I discussed the limitations of evaluation and management by telemedicine and the availability of in person appointments. The patient expressed understanding and agreed to proceed.  CC: pt c/o headahce since 07/24/19, dizzy and vomitting started 2-3 days ago/ took excedrin otc  History of Present Illness: Dizziness This is a recurrent problem. The current episode started 1 to 4 weeks ago. The problem occurs intermittently. The problem has been waxing and waning. Associated symptoms include anorexia, headaches, nausea, vertigo and vomiting. Pertinent negatives include no chills, congestion, coughing, fatigue, fever, myalgias, neck pain, numbness, rash, sore throat, swollen glands, urinary symptoms, visual change or weakness. Nothing aggravates the symptoms. She has tried acetaminophen, NSAIDs, rest, position changes, ice, heat and oral narcotics for the symptoms. The treatment provided mild relief.   Observations/Objective: Physical Exam  Constitutional: She is oriented to person, place, and time. No distress.  Eyes: Conjunctivae and EOM are normal.  Pulmonary/Chest: Effort normal.  Musculoskeletal:     Cervical back: Normal range of motion and neck supple.  Neurological: She is alert and oriented to person, place, and time. No cranial nerve deficit.  Skin: Skin is dry. She is not diaphoretic.  Psychiatric: She has a normal mood and affect. Her behavior is normal. Thought content normal.  Vitals reviewed.  Assessment and Plan: [name] was seen today for dizziness.  Diagnoses and all orders for this visit:  Intractable migraine with aura with status migrainosus -     CT Head Wo Contrast; Future -      SUMAtriptan (IMITREX) 25 MG tablet; Take 1 tablet (25 mg total) by mouth every 2 (two) hours as needed for migraine. May repeat in 2 hours if headache persists or recurs. No more than 100mg  in 24hrs -     promethazine (PHENERGAN) 12.5 MG tablet; Take 1 tablet (12.5 mg total) by mouth every 8 (eight) hours as needed for nausea or vomiting.  Acute non-recurrent pansinusitis -     doxycycline (VIBRA-TABS) 100 MG tablet; Take 1 tablet (100 mg total) by mouth 2 (two) times daily for 7 days. -     predniSONE (STERAPRED UNI-PAK 21 TAB) 10 MG (21) TBPK tablet; As directed on package   Follow Up Instructions: See avs   I discussed the assessment and treatment plan with the patient. The patient was provided an opportunity to ask questions and all were answered. The patient agreed with the plan and demonstrated an understanding of the instructions.   The patient was advised to call back or seek an in-person evaluation if the symptoms worsen or if the condition fails to improve as anticipated.   , NP

## 2019-07-31 NOTE — Telephone Encounter (Signed)
I returned pt's call.   She is c/o continued vomiting and headaches since seeing Wilfred Lacy, NP on 07/24/2019.  Only thing she can keep down in water.  See triage notes.  I encouraged her to continue drinking as much water as possible since that is the only thing she can keep down.   All other fluids she vomits.  She verbalized understanding.  I warm transferred her call into Winslow Nche's office to [place] for a virtual appt to be scheduled due to the COVID-19 pandemic.  I sent my notes to the office.   Reason for Disposition . [1] MILD or MODERATE vomiting AND [2] present > 48 hours (2 days) (Exception: mild vomiting with associated diarrhea)  Answer Assessment - Initial Assessment Questions 1. VOMITING SEVERITY: "How many times have you vomited in the past 24 hours?"     - MILD:  1 - 2 times/day    - MODERATE: 3 - 5 times/day, decreased oral intake without significant weight loss or symptoms of dehydration    - SEVERE: 6 or more times/day, vomits everything or nearly everything, with significant weight loss, symptoms of dehydration      4-5 times I've vomited this morning and yesterday. 2. ONSET: "When did the vomiting begin?"      3 days ago 3. FLUIDS: "What fluids or food have you vomited up today?" "Have you been able to keep any fluids down?"     I can't keep anything down.   Even water makes me feel quesy but it stays down.   Everything else makes it come up. 4. ABDOMINAL PAIN: "Are your having any abdominal pain?" If yes : "How bad is it and what does it feel like?" (e.g., crampy, dull, intermittent, constant)      Left side abd pain in lower part sometimes in the top. 5. DIARRHEA: "Is there any diarrhea?" If so, ask: "How many times today?"      No diarrhea.   I had my first BM yesterday in a week.    6. CONTACTS: "Is there anyone else in the family with the same symptoms?"      NO    No COVID-19 virus exposure she is aware of. 7. CAUSE: "What do you think is causing your  vomiting?"     I felt like this when I was pregnant.    I took a test and it was negative yesterday.    I used an OTC test. 8. HYDRATION STATUS: "Any signs of dehydration?" (e.g., dry mouth [not only dry lips], too weak to stand) "When did you last urinate?"     My urine is a little dark but I'm urinating the same amount.    I'm drinking a cup of water this morning.   I'm trying to get the 64 oz in.   9. OTHER SYMPTOMS: "Do you have any other symptoms?" (e.g., fever, headache, vertigo, vomiting blood or coffee grounds, recent head injury)     I have a history of migraines.   I have them intermittently.    I was having a bad headache and back pain when I saw Baldo Ash in Dec and I was diagnosed with the flu.   The back pain is gone but I'm still having the headaches.   Vomiting is in the mornings and the headache is later in the day. 10. PREGNANCY: "Is there any chance you are pregnant?" "When was your last menstrual period?"       My last period was  supposed to be right before Christmas but I only had a few brown spots for 4-5 days.   I have blood clots normally with my periods.   I have an IUD.  Protocols used: [facility]

## 2019-07-31 NOTE — Patient Instructions (Addendum)
Ct indicates sinus infection. No intracranial mass or bleeding Sent oral abx and oral prednisone.  Check BP and send through mychart when you return home. Use imitrex and promethazine for current symptoms. Avoid driving with current symptoms. Maintain adequate oral hydration.

## 2019-08-01 ENCOUNTER — Ambulatory Visit
Admission: RE | Admit: 2019-08-01 | Discharge: 2019-08-01 | Disposition: A | Discharge status: Home / Self Care | Payer: BC Managed Care – PPO | Source: Ambulatory Visit | Attending: Nurse Practitioner | Admitting: Nurse Practitioner

## 2019-08-01 ENCOUNTER — Encounter: Payer: Self-pay | Admitting: Nurse Practitioner

## 2019-08-01 DIAGNOSIS — R519 Headache, unspecified: Secondary | ICD-10-CM | POA: Diagnosis not present

## 2019-08-01 DIAGNOSIS — G43111 Migraine with aura, intractable, with status migrainosus: Secondary | ICD-10-CM

## 2019-08-02 MED ORDER — DOXYCYCLINE HYCLATE 100 MG PO TABS: 100.0000 mg | ORAL_TABLET | Freq: Two times a day (BID) | ORAL | 0 refills | Status: AC

## 2019-08-02 MED ORDER — PREDNISONE 10 MG (21) PO TBPK: ORAL_TABLET | ORAL | 0 refills | Status: DC

## 2019-08-02 NOTE — Addendum Note (Signed)
Addended by: Michaela Corner on: 08/02/2019 08:36 AM   Modules accepted: Orders, Level of Service

## 2019-09-07 DIAGNOSIS — N898 Other specified noninflammatory disorders of vagina: Secondary | ICD-10-CM | POA: Diagnosis not present

## 2019-09-07 DIAGNOSIS — N939 Abnormal uterine and vaginal bleeding, unspecified: Secondary | ICD-10-CM | POA: Diagnosis not present

## 2019-11-08 ENCOUNTER — Ambulatory Visit: Payer: BC Managed Care – PPO | Attending: Internal Medicine

## 2019-11-08 DIAGNOSIS — Z20822 Contact with and (suspected) exposure to covid-19: Secondary | ICD-10-CM | POA: Diagnosis not present

## 2019-11-09 LAB — NOVEL CORONAVIRUS, NAA: SARS-CoV-2, NAA: NOT DETECTED

## 2019-11-09 LAB — SARS-COV-2, NAA 2 DAY TAT

## 2019-12-06 DIAGNOSIS — Z113 Encounter for screening for infections with a predominantly sexual mode of transmission: Secondary | ICD-10-CM | POA: Diagnosis not present

## 2019-12-06 DIAGNOSIS — Z6834 Body mass index (BMI) 34.0-34.9, adult: Secondary | ICD-10-CM | POA: Diagnosis not present

## 2019-12-06 DIAGNOSIS — Z130 Encounter for screening for diseases of the blood and blood-forming organs and certain disorders involving the immune mechanism: Secondary | ICD-10-CM | POA: Diagnosis not present

## 2019-12-06 DIAGNOSIS — Z1151 Encounter for screening for human papillomavirus (HPV): Secondary | ICD-10-CM | POA: Diagnosis not present

## 2019-12-06 DIAGNOSIS — Z124 Encounter for screening for malignant neoplasm of cervix: Secondary | ICD-10-CM | POA: Diagnosis not present

## 2019-12-06 DIAGNOSIS — L293 Anogenital pruritus, unspecified: Secondary | ICD-10-CM | POA: Diagnosis not present

## 2019-12-06 DIAGNOSIS — Z01419 Encounter for gynecological examination (general) (routine) without abnormal findings: Secondary | ICD-10-CM | POA: Diagnosis not present

## 2019-12-06 LAB — HM PAP SMEAR: HM Pap smear: NEGATIVE

## 2019-12-06 LAB — RESULTS CONSOLE HPV: CHL HPV: NEGATIVE

## 2019-12-13 DIAGNOSIS — Z113 Encounter for screening for infections with a predominantly sexual mode of transmission: Secondary | ICD-10-CM | POA: Diagnosis not present

## 2020-01-09 DIAGNOSIS — N9089 Other specified noninflammatory disorders of vulva and perineum: Secondary | ICD-10-CM | POA: Diagnosis not present

## 2020-01-09 DIAGNOSIS — B373 Candidiasis of vulva and vagina: Secondary | ICD-10-CM | POA: Diagnosis not present

## 2020-01-12 ENCOUNTER — Encounter: Payer: Self-pay | Admitting: Nurse Practitioner

## 2020-03-05 ENCOUNTER — Ambulatory Visit
Admission: EM | Admit: 2020-03-05 | Discharge: 2020-03-05 | Disposition: A | Discharge status: Home / Self Care | Payer: BC Managed Care – PPO | Attending: Emergency Medicine | Admitting: Emergency Medicine

## 2020-03-05 DIAGNOSIS — J029 Acute pharyngitis, unspecified: Secondary | ICD-10-CM | POA: Diagnosis not present

## 2020-03-05 DIAGNOSIS — Z20818 Contact with and (suspected) exposure to other bacterial communicable diseases: Secondary | ICD-10-CM | POA: Diagnosis not present

## 2020-03-05 DIAGNOSIS — Z20822 Contact with and (suspected) exposure to covid-19: Secondary | ICD-10-CM | POA: Insufficient documentation

## 2020-03-05 LAB — POCT RAPID STREP A (OFFICE): Rapid Strep A Screen: NEGATIVE

## 2020-03-05 NOTE — Discharge Instructions (Addendum)

## 2020-03-05 NOTE — ED Provider Notes (Signed)
[facility]    CSN: [id number] Arrival date & time: 03/05/20  1025      History   Chief Complaint Chief Complaint  Patient presents with  . exposure to covid 19  . Sore Throat    HPI Kristine [name] is a 29 y.o. female   Subjective:   History was provided by the patient. Kristine [name] is a 29 y.o. female who presents for evaluation of a sore throat. Associated symptoms include dry cough and sore throat. Onset of symptoms was 1 week ago, unchanged since that time.  She is drinking plenty of fluids. She has had recent close exposure to someone with proven streptococcal pharyngitis. The following portions of the patient's history were reviewed and updated as appropriate: allergies, current medications, past family history, past medical history, past social history, past surgical history and problem list.     Past Medical History:  Diagnosis Date  . MRSA infection 2017   right upper thigh  . S/P cesarean section 01/06/2016  . STD (sexually transmitted disease) 03/2014   Chlamydia    Patient Active Problem List   Diagnosis Date Noted  . Hx of migraine headaches 07/24/2019  . Pain in left foot 12/22/2017  . History of cesarean section 01/06/2016  . Menorrhagia 04/17/2014  . Acute blood loss anemia 04/17/2014    Past Surgical History:  Procedure Laterality Date  . BONE EXCISION Left 08/18/2018   Procedure: Excision of Accessory Navicular;  Surgeon: Toni Arthurs, MD;  Location: [facility];  Service: Orthopedics;  Laterality: Left;  . CESAREAN SECTION N/A 01/06/2016   Procedure: CESAREAN SECTION;  Surgeon: Sherian Rein, MD;  [facility];  Service: Obstetrics;  Laterality: N/A;  . GASTROCNEMIUS RECESSION Left 08/18/2018   Procedure: Left gastroc recession;  Surgeon: Toni Arthurs, MD;  Location: [facility];  Service: Orthopedics;  Laterality: Left;  . TENDON TRANSFER Left 08/18/2018   Procedure: Transfer of  Posterior Tibial Tendon;  Surgeon: Toni Arthurs, MD;  Location: [facility];  Service: Orthopedics;  Laterality: Left;  . WISDOM TOOTH EXTRACTION      OB History    Gravida  1   Para  1   Term  1   Preterm      AB      Living  1     SAB      TAB      Ectopic      Multiple  0   Live Births  1            Home Medications    Prior to Admission medications   Medication Sig Start Date End Date Taking? Authorizing Provider  levonorgestrel (MIRENA, 52 MG,) 20 MCG/24HR IUD 1 each by Intrauterine route once.    [provider]  predniSONE (STERAPRED UNI-PAK 21 TAB) 10 MG (21) TBPK tablet As directed on package 08/02/19   Nche, Bonna Gains, NP  promethazine (PHENERGAN) 12.5 MG tablet Take 1 tablet (12.5 mg total) by mouth every 8 (eight) hours as needed for nausea or vomiting. 07/31/19   Nche, Bonna Gains, NP  SUMAtriptan (IMITREX) 25 MG tablet Take 1 tablet (25 mg total) by mouth every 2 (two) hours as needed for migraine. May repeat in 2 hours if headache persists or recurs. No more than 100mg  in 24hrs 07/31/19   Nche, 09/28/19, NP    Family History Family History  Problem Relation Age of Onset  . Multiple sclerosis Father   .  Hypertension Mother   . Heart disease Maternal Grandmother     Social History Social History   Tobacco Use  . Smoking status: Never Smoker  . Smokeless tobacco: Never Used  Vaping Use  . Vaping Use: Never used  Substance Use Topics  . Alcohol use: Yes    Alcohol/week: 2.0 standard drinks    Types: 2 Standard drinks or equivalent per week  . Drug use: No     Allergies   Bactroban  [mupirocin] and Bactrim [sulfamethoxazole-trimethoprim]   Review of Systems As per HPI   Physical Exam Triage Vital Signs ED Triage Vitals  Enc Vitals Group     BP      Pulse      Resp      Temp      Temp src      SpO2      Weight      Height      Head Circumference      Peak Flow      Pain Score      Pain Loc       Pain Edu?      Excl. in GC?    No data found.  Updated Vital Signs BP 125/84 (BP Location: Left Arm)   Pulse 75   Temp 98.3 F (36.8 C) (Oral)   Resp 16   SpO2 100%   Visual Acuity Right Eye Distance:   Left Eye Distance:   Bilateral Distance:    Right Eye Near:   Left Eye Near:    Bilateral Near:     Physical Exam Constitutional:      General: She is not in acute distress.    Appearance: She is not ill-appearing or diaphoretic.  HENT:     Head: Normocephalic and atraumatic.     Right Ear: Tympanic membrane and ear canal normal.     Left Ear: Tympanic membrane and ear canal normal.     Mouth/Throat:     Mouth: Mucous membranes are moist.     Pharynx: Oropharynx is clear. No oropharyngeal exudate or posterior oropharyngeal erythema.     Tonsils: No tonsillar exudate.  Eyes:     General: No scleral icterus.    Conjunctiva/sclera: Conjunctivae normal.     Pupils: Pupils are equal, round, and reactive to light.  Neck:     Comments: Trachea midline, negative JVD Cardiovascular:     Rate and Rhythm: Normal rate and regular rhythm.     Heart sounds: No murmur heard.  No gallop.   Pulmonary:     Effort: Pulmonary effort is normal. No respiratory distress.     Breath sounds: No wheezing, rhonchi or rales.  Musculoskeletal:     Cervical back: Neck supple. No tenderness.  Lymphadenopathy:     Cervical: No cervical adenopathy.  Skin:    Capillary Refill: Capillary refill takes less than 2 seconds.     Coloration: Skin is not jaundiced or pale.     Findings: No rash.  Neurological:     General: No focal deficit present.     Mental Status: She is alert and oriented to person, place, and time.      UC Treatments / Results  Labs (all labs ordered are listed, but only abnormal results are displayed) Labs Reviewed  NOVEL CORONAVIRUS, NAA  CULTURE, GROUP A STREP [facility])  POCT RAPID STREP A (OFFICE)    EKG   Radiology No results  found.  Procedures Procedures (including critical care time)  Medications Ordered in UC Medications - No data to display  Initial Impression / Assessment and Plan / UC Course  I have reviewed the triage vital signs and the nursing notes.  Pertinent labs & imaging results that were available during my care of the patient were reviewed by me and considered in my medical decision making (see chart for details).     Plan:  Use of OTC analgesics recommended as well as salt water gargles. Follow up as needed. Rapid strep negative, culture pending. covid pcr pending  Final Clinical Impressions(s) / UC Diagnoses   Final diagnoses:  Encounter for laboratory testing for COVID-19 virus  Strep throat exposure     Discharge Instructions     Your rapid strep test was negative today.  The culture is pending.  Please look on your MyChart for test results.   We will notify you if the culture positive and outline a treatment plan at that time.   Please continue Tylenol and/or Ibuprofen as needed for fever, pain.  May try warm salt water gargles, cepacol lozenges, throat spray, warm tea or water with lemon/honey, or OTC cold relief medicine for throat discomfort.   For congestion: take a daily anti-histamine like Zyrtec, Claritin, and a oral decongestant to help with post nasal drip that may be irritating your throat.   It is important to stay hydrated: drink plenty of fluids (primarily water) to keep your throat moisturized and help further relieve irritation/discomfort.     ED Prescriptions    None     PDMP not reviewed this encounter.   Hall[place]Potvin, [place], [place] 03/05/20 1116

## 2020-03-05 NOTE — ED Triage Notes (Signed)
Pt present sore throat possibly exposure to covid 19. Symptoms started over a week ago. But this am she woke up with her lymph nodes swollen.

## 2020-03-06 LAB — NOVEL CORONAVIRUS, NAA: SARS-CoV-2, NAA: NOT DETECTED

## 2020-03-06 LAB — SARS-COV-2, NAA 2 DAY TAT

## 2020-03-07 LAB — CULTURE, GROUP A STREP (THRC)

## 2020-03-18 IMAGING — CT CT HEAD W/O CM
1 series · 16 of 30 positions shown, 20 images · non-contrast
Comparison: None.

CLINICAL DATA: Headache and intermittent dizziness

EXAM:
CT HEAD WITHOUT CONTRAST
TECHNIQUE: Contiguous axial images were obtained from the base of the skull
through the vertex without intravenous contrast.

[Series 2: head w/(date) · axial · 0.46mm/px · z∈[+644,+779]mm · 16 of 30 slices shown, 20 images]
[im 2/30  brain]
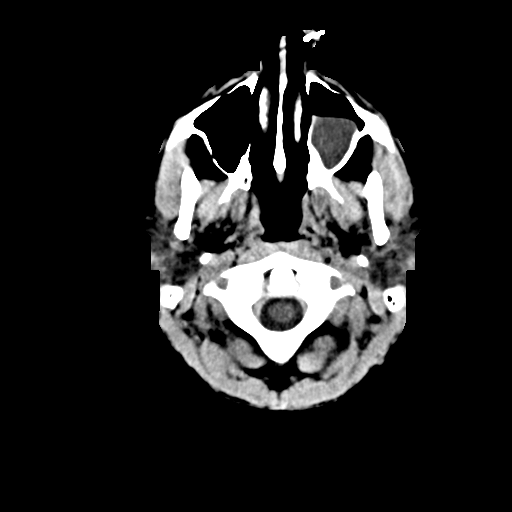
[im 2/30  bone]
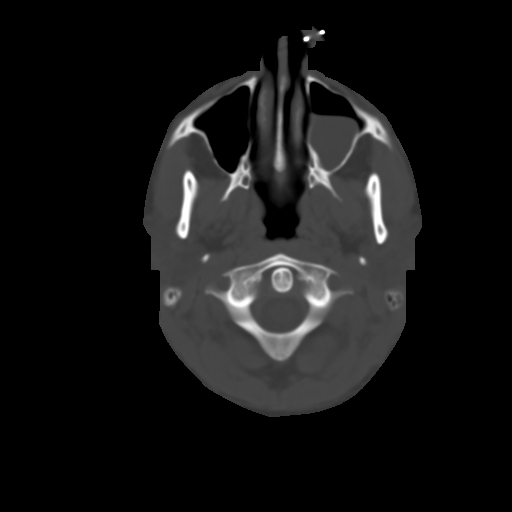
[im 4/30  brain]
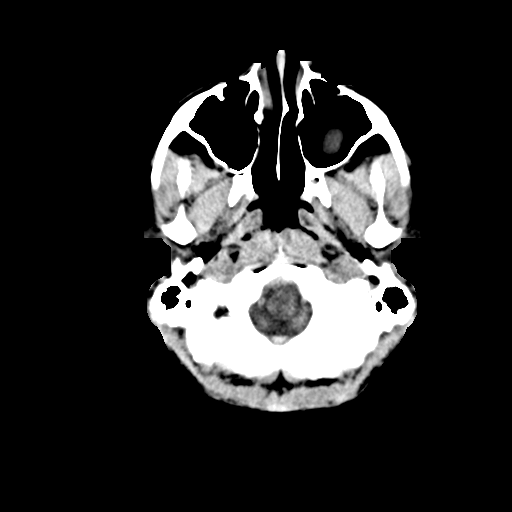
[im 6/30  brain]
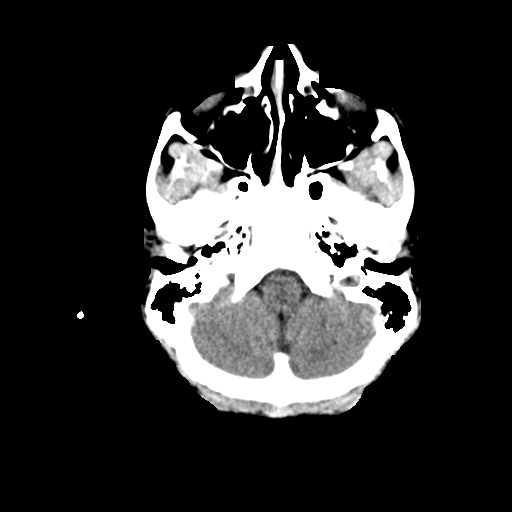
[im 8/30  brain]
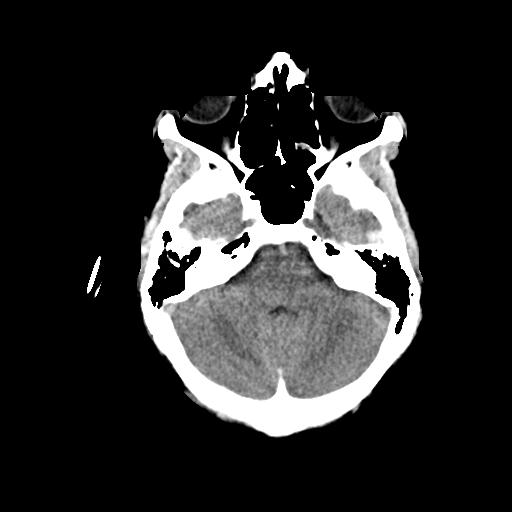
[im 9/30  brain]
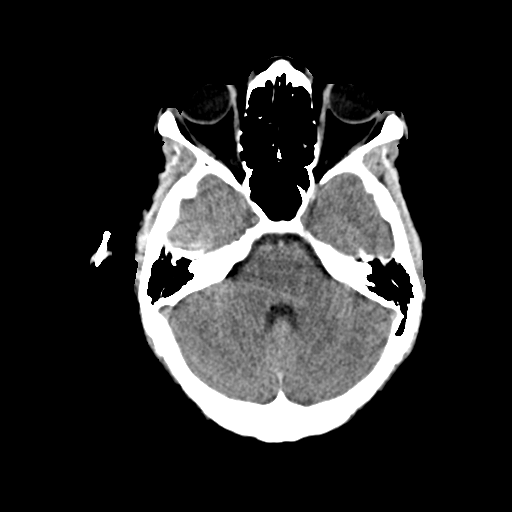
[im 9/30  bone]
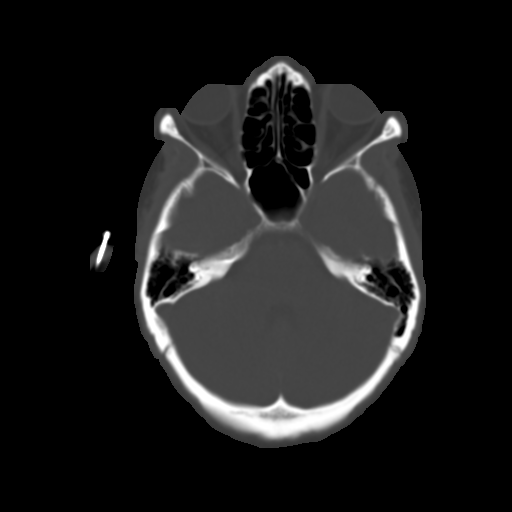
[im 11/30  brain]
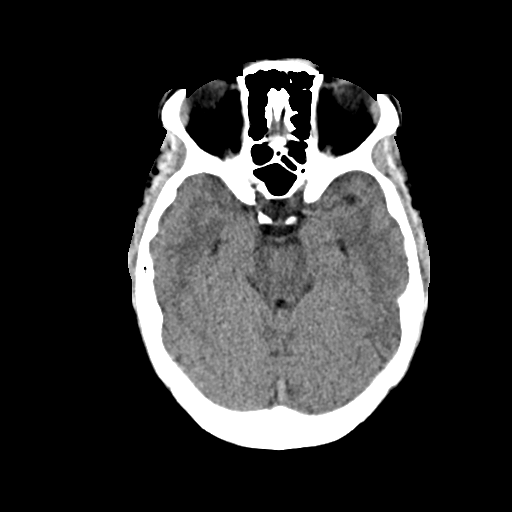
[im 13/30  brain]
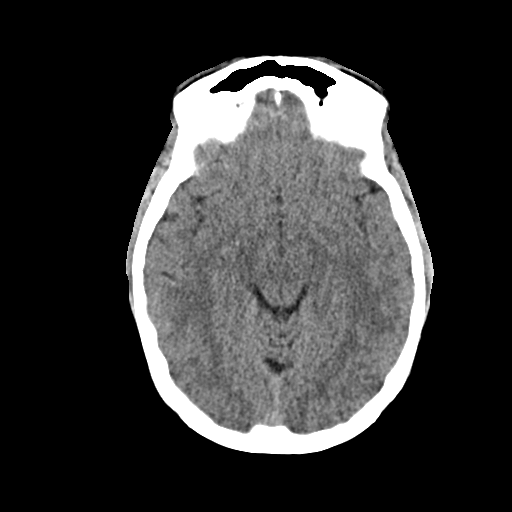
[im 15/30  brain]
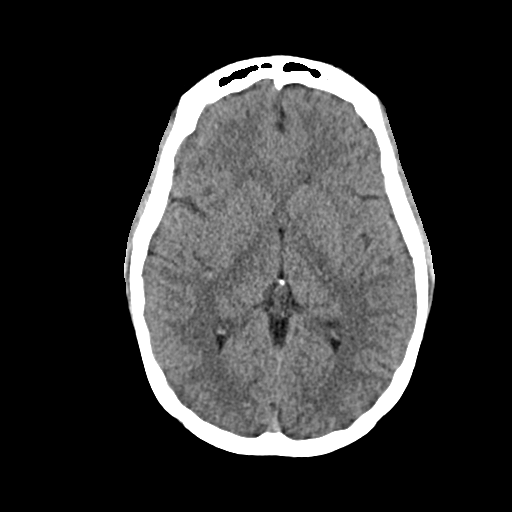
[im 16/30  brain]
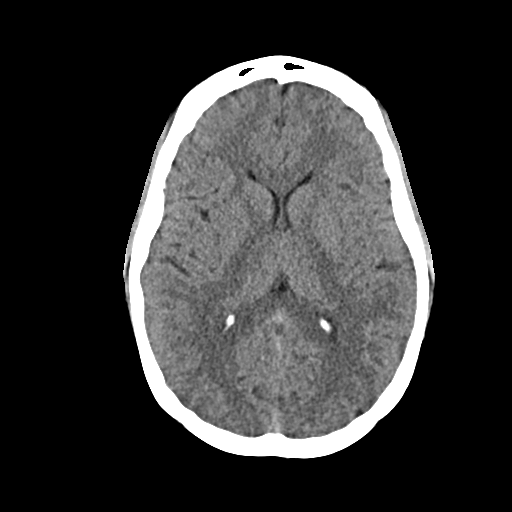
[im 16/30  bone]
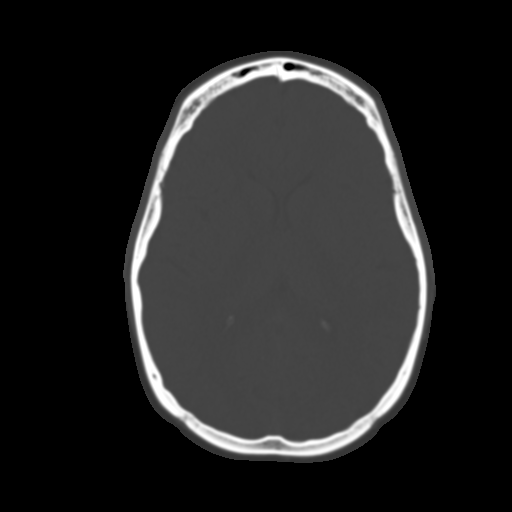
[im 18/30  brain]
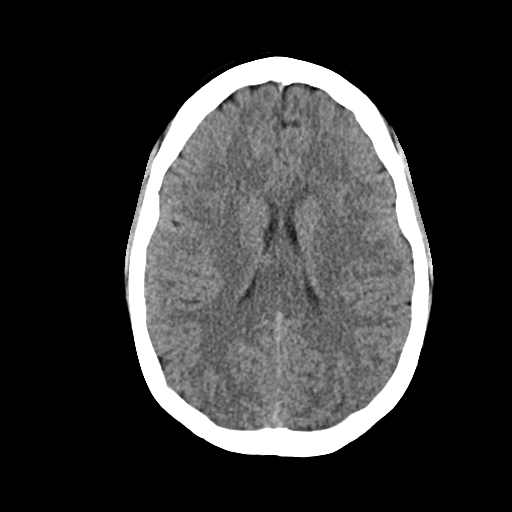
[im 20/30  brain]
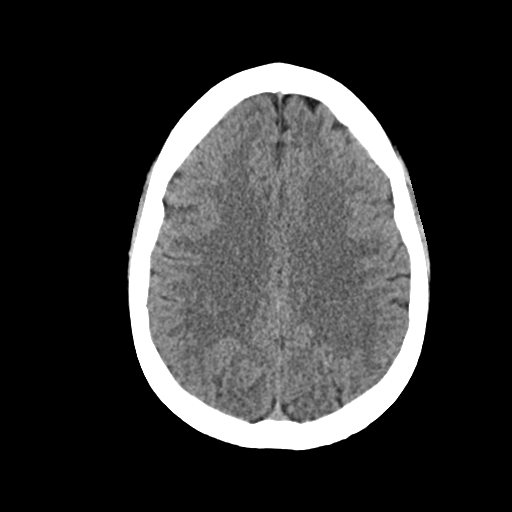
[im 22/30  brain]
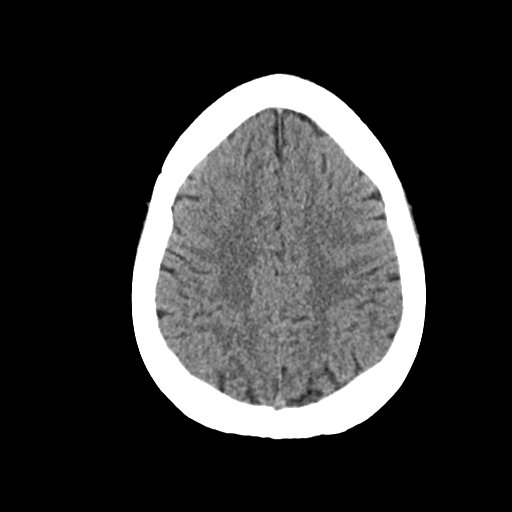
[im 23/30  brain]
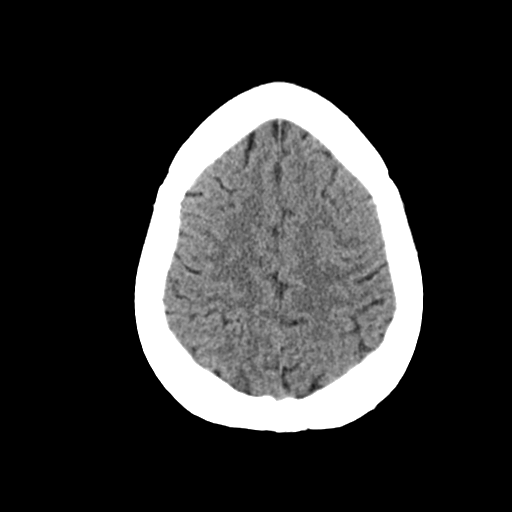
[im 23/30  bone]
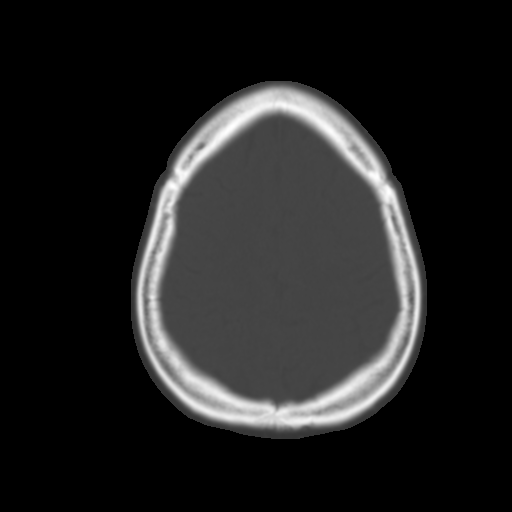
[im 25/30  brain]
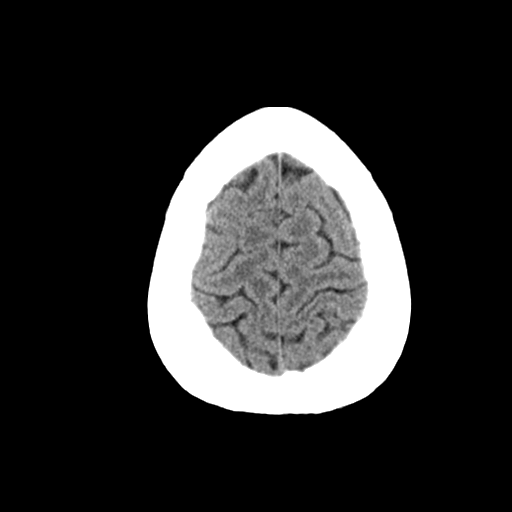
[im 27/30  brain]
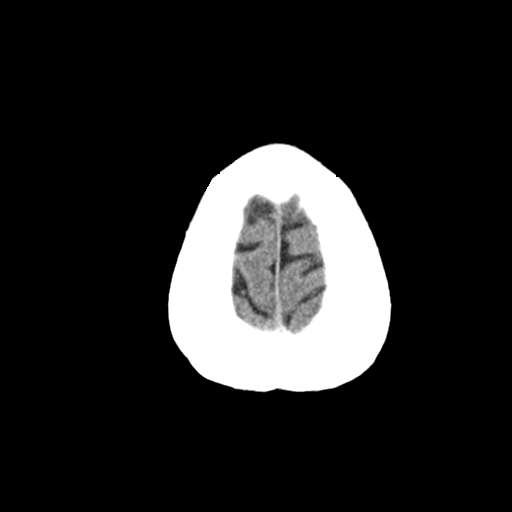
[im 29/30  brain]
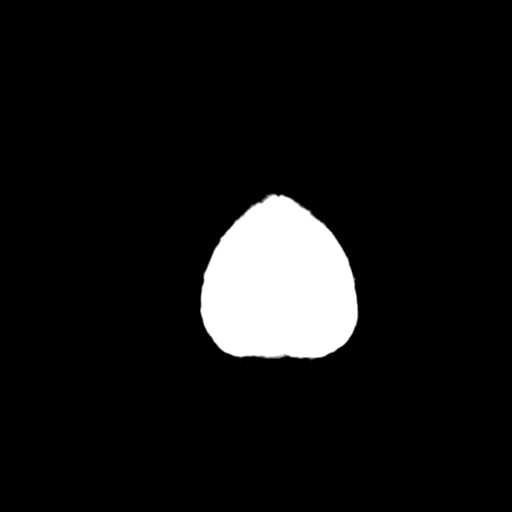

[16 of 30 positions shown; findings below may reference images not displayed]

FINDINGS: Brain: Ventricles and sulci appear normal in size and contour. There
is no intracranial mass, hemorrhage, extra-axial fluid collection,
or midline shift. Brain parenchyma appears unremarkable. No acute
infarct evident.

Vascular: No evident hyperdense vessel. No vascular calcifications
are evident.

Skull: The bony calvarium appears intact.

Sinuses/Orbits: There is a sizable retention cyst in the left
maxillary antrum. There is mild mucosal thickening in several
ethmoid air cells. Other visualized paranasal sinuses are clear.
Orbits appear symmetric bilaterally.

Other: Mastoid air cells are clear.
IMPRESSION: Foci of paranasal sinus disease.  Study otherwise unremarkable.

## 2020-03-19 ENCOUNTER — Other Ambulatory Visit: Payer: Self-pay

## 2020-03-19 ENCOUNTER — Other Ambulatory Visit: Payer: BC Managed Care – PPO

## 2020-03-19 DIAGNOSIS — Z20822 Contact with and (suspected) exposure to covid-19: Secondary | ICD-10-CM

## 2020-03-21 LAB — SARS-COV-2, NAA 2 DAY TAT

## 2020-03-21 LAB — NOVEL CORONAVIRUS, NAA: SARS-CoV-2, NAA: NOT DETECTED

## 2020-04-02 DIAGNOSIS — Z111 Encounter for screening for respiratory tuberculosis: Secondary | ICD-10-CM | POA: Diagnosis not present

## 2020-04-23 ENCOUNTER — Telehealth: Payer: Self-pay

## 2020-04-23 ENCOUNTER — Other Ambulatory Visit: Payer: BC Managed Care – PPO

## 2020-04-23 ENCOUNTER — Encounter: Payer: Self-pay | Admitting: Family Medicine

## 2020-04-23 ENCOUNTER — Telehealth (INDEPENDENT_AMBULATORY_CARE_PROVIDER_SITE_OTHER): Payer: BC Managed Care – PPO | Admitting: Family Medicine

## 2020-04-23 VITALS — Temp 99.0°F | Ht 61.0 in | Wt 175.0 lb

## 2020-04-23 DIAGNOSIS — J069 Acute upper respiratory infection, unspecified: Secondary | ICD-10-CM

## 2020-04-23 DIAGNOSIS — Z20822 Contact with and (suspected) exposure to covid-19: Secondary | ICD-10-CM | POA: Diagnosis not present

## 2020-04-23 NOTE — Patient Instructions (Signed)
Nasal saline spray 2-3x/day Rest, increase fluids intake Salt water gargles, lozenges as needed for sore throat Can take either mucinex 1 tab twice per day x 5-7 days ot zyrtec 1 tab daily x 5-7 days

## 2020-04-23 NOTE — Progress Notes (Signed)
Virtual Visit via Video Note  I connected with Kristine [name] on 04/23/20 at  4:00 PM EDT by a video enabled telemedicine application and verified that I am speaking with the correct person using two identifiers. Location patient: home Location provider: work  Persons participating in the virtual visit: patient, provider  I discussed the limitations of evaluation and management by telemedicine and the availability of in person appointments. The patient expressed understanding and agreed to proceed.  Chief Complaint  Patient presents with  . Acute Visit    cough, runny nose &  congestion, Sore throat x 3 days.  has taken Day Quil and Thera Flu with little relief.      HPI: Kristine [name] is a 29 y.o. female patient of my colleague Alysia Penna who complains of 2 day h/o sore throat, runny nose, nasal congestion. Mild cough which started today. She had covid test this AM and it is pending.  Pt has tried taking DayQuil and Thera-Flu with minimal relief.  Pt had covid vaccine. No known exposure.  Pt also states she gets a cold each year around this time as season and weather changes.   Past Medical History:  Diagnosis Date  . MRSA infection 2017   right upper thigh  . S/P cesarean section 01/06/2016  . STD (sexually transmitted disease) 03/2014   Chlamydia    Past Surgical History:  Procedure Laterality Date  . BONE EXCISION Left 08/18/2018   Procedure: Excision of Accessory Navicular;  Surgeon: Toni Arthurs, MD;  Location: [facility];  Service: Orthopedics;  Laterality: Left;  . CESAREAN SECTION N/A 01/06/2016   Procedure: CESAREAN SECTION;  Surgeon: Sherian Rein, MD;  [facility];  Service: Obstetrics;  Laterality: N/A;  . GASTROCNEMIUS RECESSION Left 08/18/2018   Procedure: Left gastroc recession;  Surgeon: Toni Arthurs, MD;  Location: [facility];  Service: Orthopedics;  Laterality: Left;  . TENDON TRANSFER Left 08/18/2018    Procedure: Transfer of Posterior Tibial Tendon;  Surgeon: Toni Arthurs, MD;  Location: [facility];  Service: Orthopedics;  Laterality: Left;  . WISDOM TOOTH EXTRACTION      Family History  Problem Relation Age of Onset  . Multiple sclerosis Father   . Hypertension Mother   . Heart disease Maternal Grandmother     Social History   Tobacco Use  . Smoking status: Never Smoker  . Smokeless tobacco: Never Used  Vaping Use  . Vaping Use: Never used  Substance Use Topics  . Alcohol use: Yes    Alcohol/week: 2.0 standard drinks    Types: 2 Standard drinks or equivalent per week  . Drug use: No     Current Outpatient Medications:  .  clobetasol cream (TEMOVATE) 0.05 %, Apply topically., Disp: , Rfl:  .  levonorgestrel (MIRENA, 52 MG,) 20 MCG/24HR IUD, 1 each by Intrauterine route once., Disp: , Rfl:  .  predniSONE (STERAPRED UNI-PAK 21 TAB) 10 MG (21) TBPK tablet, As directed on package (Patient not taking: Reported on 04/23/2020), Disp: 21 tablet, Rfl: 0 .  promethazine (PHENERGAN) 12.5 MG tablet, Take 1 tablet (12.5 mg total) by mouth every 8 (eight) hours as needed for nausea or vomiting. (Patient not taking: Reported on 04/23/2020), Disp: 20 tablet, Rfl: 0 .  SUMAtriptan (IMITREX) 25 MG tablet, Take 1 tablet (25 mg total) by mouth every 2 (two) hours as needed for migraine. May repeat in 2 hours if headache persists or recurs. No more than 100mg  in 24hrs (Patient  not taking: Reported on 04/23/2020), Disp: 8 tablet, Rfl: 0  Allergies  Allergen Reactions  . Bactroban  [Mupirocin] Hives  . Bactrim [Sulfamethoxazole-Trimethoprim] Rash      ROS: See pertinent positives and negatives per HPI.   EXAM:  VITALS per patient if applicable: Temp 99 F (37.2 C) (Temporal)   Ht 5\' 1"  (1.549 m)   Wt 175 lb (79.4 kg) Comment: pt reported  BMI 33.07 kg/m    GENERAL: alert, oriented, appears well and in no acute distress  HEENT: atraumatic, conjunctiva clear, no obvious  abnormalities on inspection of external nose and ears  NECK: normal movements of the head and neck  LUNGS: on inspection no signs of respiratory distress, breathing rate appears normal, no obvious gross SOB, gasping or wheezing, no conversational dyspnea  CV: no obvious cyanosis  PSYCH/NEURO: pleasant and cooperative, speech and thought processing grossly intact   ASSESSMENT AND PLAN: 1. Viral URI with cough - covid test pending - nasal saline spray 2-3x/day, zyrtec 1 tab daily or mucinex 1 tab BID, salt water gargles PRN - rest, increase fluids - note for work sent thru - f/u if symptoms worsen or do not improve in 7-10 days    I discussed the assessment and treatment plan with the patient. The patient was provided an opportunity to ask questions and all were answered. The patient agreed with the plan and demonstrated an understanding of the instructions.   The patient was advised to call back or seek an in-person evaluation if the symptoms worsen or if the condition fails to improve as anticipated.   9-10, DO

## 2020-04-23 NOTE — Telephone Encounter (Signed)
Scheduled for COVID 19 test.

## 2020-04-24 LAB — SARS-COV-2, NAA 2 DAY TAT

## 2020-04-24 LAB — NOVEL CORONAVIRUS, NAA: SARS-CoV-2, NAA: NOT DETECTED

## 2020-04-30 ENCOUNTER — Encounter: Payer: BC Managed Care – PPO | Admitting: Nurse Practitioner

## 2022-06-15 DIAGNOSIS — Z3201 Encounter for pregnancy test, result positive: Secondary | ICD-10-CM | POA: Diagnosis not present

## 2022-06-15 DIAGNOSIS — Z23 Encounter for immunization: Secondary | ICD-10-CM | POA: Diagnosis not present

## 2022-06-16 ENCOUNTER — Other Ambulatory Visit: Payer: Self-pay

## 2022-07-13 DIAGNOSIS — Z113 Encounter for screening for infections with a predominantly sexual mode of transmission: Secondary | ICD-10-CM | POA: Diagnosis not present

## 2022-07-13 DIAGNOSIS — O26891 Other specified pregnancy related conditions, first trimester: Secondary | ICD-10-CM | POA: Diagnosis not present

## 2022-07-13 DIAGNOSIS — Z3A10 10 weeks gestation of pregnancy: Secondary | ICD-10-CM | POA: Diagnosis not present

## 2022-07-13 DIAGNOSIS — O99211 Obesity complicating pregnancy, first trimester: Secondary | ICD-10-CM | POA: Diagnosis not present

## 2022-07-13 DIAGNOSIS — Z3401 Encounter for supervision of normal first pregnancy, first trimester: Secondary | ICD-10-CM | POA: Diagnosis not present

## 2022-07-13 DIAGNOSIS — Z369 Encounter for antenatal screening, unspecified: Secondary | ICD-10-CM | POA: Diagnosis not present

## 2022-07-13 DIAGNOSIS — Z368A Encounter for antenatal screening for other genetic defects: Secondary | ICD-10-CM | POA: Diagnosis not present

## 2022-07-13 DIAGNOSIS — Z3481 Encounter for supervision of other normal pregnancy, first trimester: Secondary | ICD-10-CM | POA: Diagnosis not present

## 2022-07-13 LAB — OB RESULTS CONSOLE RUBELLA ANTIBODY, IGM: Rubella: IMMUNE

## 2022-07-13 LAB — OB RESULTS CONSOLE GC/CHLAMYDIA
Chlamydia: NEGATIVE
Neisseria Gonorrhea: NEGATIVE

## 2022-07-13 LAB — OB RESULTS CONSOLE RPR: RPR: NONREACTIVE

## 2022-07-13 LAB — OB RESULTS CONSOLE HEPATITIS B SURFACE ANTIGEN: Hepatitis B Surface Ag: NEGATIVE

## 2022-07-13 LAB — OB RESULTS CONSOLE ANTIBODY SCREEN: Antibody Screen: NEGATIVE

## 2022-07-13 LAB — OB RESULTS CONSOLE HIV ANTIBODY (ROUTINE TESTING): HIV: NONREACTIVE

## 2022-07-13 LAB — OB RESULTS CONSOLE ABO/RH: RH Type: NEGATIVE

## 2022-07-13 LAB — HEPATITIS C ANTIBODY: HCV Ab: NEGATIVE

## 2022-08-11 DIAGNOSIS — Z3A14 14 weeks gestation of pregnancy: Secondary | ICD-10-CM | POA: Diagnosis not present

## 2022-08-11 DIAGNOSIS — Z3482 Encounter for supervision of other normal pregnancy, second trimester: Secondary | ICD-10-CM | POA: Diagnosis not present

## 2022-09-08 DIAGNOSIS — Z363 Encounter for antenatal screening for malformations: Secondary | ICD-10-CM | POA: Diagnosis not present

## 2022-09-08 DIAGNOSIS — Z3A18 18 weeks gestation of pregnancy: Secondary | ICD-10-CM | POA: Diagnosis not present

## 2022-11-05 DIAGNOSIS — Z3689 Encounter for other specified antenatal screening: Secondary | ICD-10-CM | POA: Diagnosis not present

## 2022-11-05 DIAGNOSIS — Z365 Encounter for antenatal screening for isoimmunization: Secondary | ICD-10-CM | POA: Diagnosis not present

## 2022-11-05 DIAGNOSIS — Z23 Encounter for immunization: Secondary | ICD-10-CM | POA: Diagnosis not present

## 2022-11-05 DIAGNOSIS — Z2913 Encounter for prophylactic Rho(D) immune globulin: Secondary | ICD-10-CM | POA: Diagnosis not present

## 2023-01-06 DIAGNOSIS — Z3493 Encounter for supervision of normal pregnancy, unspecified, third trimester: Secondary | ICD-10-CM | POA: Diagnosis not present

## 2023-01-06 DIAGNOSIS — Z3A36 36 weeks gestation of pregnancy: Secondary | ICD-10-CM | POA: Diagnosis not present

## 2023-01-12 DIAGNOSIS — Z3A36 36 weeks gestation of pregnancy: Secondary | ICD-10-CM | POA: Diagnosis not present

## 2023-01-18 ENCOUNTER — Encounter (HOSPITAL_COMMUNITY): Payer: Self-pay | Admitting: *Deleted

## 2023-01-18 ENCOUNTER — Telehealth (HOSPITAL_COMMUNITY): Payer: Self-pay | Admitting: *Deleted

## 2023-01-18 ENCOUNTER — Encounter (HOSPITAL_COMMUNITY): Payer: Self-pay

## 2023-01-18 NOTE — Patient Instructions (Addendum)
Rahi Umholtz  01/18/2023   Your procedure is scheduled on:  01/30/2023  Arrive at 0845 at Entrance C on [employer] at [facility]  and [employer]. You are invited to use the FREE valet parking or use the Visitor's parking deck.  Pick up the phone at the desk and dial [phone].  Call this number if you have problems the morning of surgery: [phone]  Remember:   Do not eat food:(After Midnight) Desps de medianoche.  Do not drink clear liquids: (After Midnight) Desps de medianoche.  Take these medicines the morning of surgery with A SIP OF WATER:  none   Do not wear jewelry, make-up or nail polish.  Do not wear lotions, powders, or perfumes. Do not wear deodorant.  Do not shave 48 hours prior to surgery.  Do not bring valuables to the hospital.  [facility] is not   responsible for any belongings or valuables brought to the hospital.  Contacts, dentures or bridgework may not be worn into surgery.  Leave suitcase in the car. After surgery it may be brought to your room.  For patients admitted to the hospital, checkout time is 11:00 AM the day of              discharge.      Please read over the following fact sheets that you were given:     Preparing for Surgery

## 2023-01-18 NOTE — Telephone Encounter (Signed)
Preadmission screen  

## 2023-01-19 ENCOUNTER — Telehealth (HOSPITAL_COMMUNITY): Payer: Self-pay | Admitting: *Deleted

## 2023-01-19 NOTE — Telephone Encounter (Signed)
Preadmission screen  

## 2023-01-20 ENCOUNTER — Telehealth (HOSPITAL_COMMUNITY): Payer: Self-pay | Admitting: *Deleted

## 2023-01-20 NOTE — Telephone Encounter (Signed)
Preadmission screen  

## 2023-01-21 ENCOUNTER — Telehealth (HOSPITAL_COMMUNITY): Payer: Self-pay | Admitting: *Deleted

## 2023-01-21 NOTE — Telephone Encounter (Signed)
Preadmission screen  

## 2023-01-22 ENCOUNTER — Telehealth (HOSPITAL_COMMUNITY): Payer: Self-pay | Admitting: *Deleted

## 2023-01-22 ENCOUNTER — Encounter (HOSPITAL_COMMUNITY): Payer: Self-pay

## 2023-01-22 NOTE — Telephone Encounter (Signed)
Preadmission screen  

## 2023-01-26 ENCOUNTER — Encounter (HOSPITAL_COMMUNITY): Payer: Self-pay | Admitting: Obstetrics and Gynecology

## 2023-01-26 ENCOUNTER — Inpatient Hospital Stay (HOSPITAL_COMMUNITY)
Admission: AD | Admit: 2023-01-26 | Discharge: 2023-01-26 | Disposition: A | Discharge status: Home / Self Care | Payer: BC Managed Care – PPO | Attending: Obstetrics and Gynecology | Admitting: Obstetrics and Gynecology

## 2023-01-26 DIAGNOSIS — Z793 Long term (current) use of hormonal contraceptives: Secondary | ICD-10-CM | POA: Diagnosis not present

## 2023-01-26 DIAGNOSIS — M545 Low back pain, unspecified: Secondary | ICD-10-CM | POA: Insufficient documentation

## 2023-01-26 DIAGNOSIS — N898 Other specified noninflammatory disorders of vagina: Secondary | ICD-10-CM

## 2023-01-26 DIAGNOSIS — O368130 Decreased fetal movements, third trimester, not applicable or unspecified: Secondary | ICD-10-CM | POA: Diagnosis not present

## 2023-01-26 DIAGNOSIS — O26899 Other specified pregnancy related conditions, unspecified trimester: Secondary | ICD-10-CM

## 2023-01-26 DIAGNOSIS — R109 Unspecified abdominal pain: Secondary | ICD-10-CM | POA: Diagnosis not present

## 2023-01-26 DIAGNOSIS — Z3A38 38 weeks gestation of pregnancy: Secondary | ICD-10-CM | POA: Insufficient documentation

## 2023-01-26 DIAGNOSIS — O26893 Other specified pregnancy related conditions, third trimester: Secondary | ICD-10-CM | POA: Insufficient documentation

## 2023-01-26 LAB — WET PREP, GENITAL
Clue Cells Wet Prep HPF POC: NONE SEEN
Sperm: NONE SEEN
Trich, Wet Prep: NONE SEEN
WBC, Wet Prep HPF POC: >=10 — AB (ref <10)
Yeast Wet Prep HPF POC: NONE SEEN

## 2023-01-26 LAB — POCT FERN TEST: POCT Fern Test: NEGATIVE

## 2023-01-26 MED ORDER — ACETAMINOPHEN 500 MG PO TABS
1000.0000 mg | ORAL_TABLET | Freq: Once | ORAL | Status: AC
  Administered 2023-01-26: 1000 mg via ORAL
  Filled 2023-01-26: qty 2

## 2023-01-26 MED ORDER — TERCONAZOLE 0.8 % VA CREA: 1.0000 | TOPICAL_CREAM | Freq: Every day | VAGINAL | 0 refills | Status: DC

## 2023-01-26 NOTE — MAU Provider Note (Signed)
History     CSN: [id number]  Arrival date and time: 01/26/23 0944   Event Date/Time   First Provider Initiated Contact with Patient 01/26/23 1035      Chief Complaint  Patient presents with   Rupture of Membranes   HPI Kristine [name] is a 32 yo G2P1001 at [redacted]w[redacted]d presenting with leaking of fluid onset 0200. Pt states that she noticed it after urinating. Pt also reports intermittent lower back pain, abdominal pain, and decreased fetal movement. She describes the abdominal pain as pressure in her pelvic and low back region, states that she is unsure if these are contractions. She states that the fetal movements have returned to baseline since arriving to MAU. Pt denies vaginal bleeding. Pt is not contracting currently.    OB History     Gravida  2   Para  1   Term  1   Preterm      AB      Living  1      SAB      IAB      Ectopic      Multiple  0   Live Births  1           Past Medical History:  Diagnosis Date   MRSA infection 2017   right upper thigh   S/P cesarean section 01/06/2016   STD (sexually transmitted disease) 03/2014   Chlamydia    Past Surgical History:  Procedure Laterality Date   BONE EXCISION Left 08/18/2018   Procedure: Excision of Accessory Navicular;  Surgeon: Toni Arthurs, MD;  Location: [facility];  Service: Orthopedics;  Laterality: Left;   CESAREAN SECTION N/A 01/06/2016   Procedure: CESAREAN SECTION;  Surgeon: Sherian Rein, MD;  [facility];  Service: Obstetrics;  Laterality: N/A;   GASTROCNEMIUS RECESSION Left 08/18/2018   Procedure: Left gastroc recession;  Surgeon: Toni Arthurs, MD;  Location: [facility];  Service: Orthopedics;  Laterality: Left;   TENDON TRANSFER Left 08/18/2018   Procedure: Transfer of Posterior Tibial Tendon;  Surgeon: Toni Arthurs, MD;  Location: [facility];  Service: Orthopedics;  Laterality: Left;   WISDOM TOOTH EXTRACTION      Family  History  Problem Relation Age of Onset   Multiple sclerosis Father    Hypertension Mother    Heart disease Maternal Grandmother     Social History   Tobacco Use   Smoking status: Never   Smokeless tobacco: Never  Vaping Use   Vaping Use: Never used  Substance Use Topics   Alcohol use: Not Currently    Alcohol/week: 2.0 standard drinks of alcohol    Types: 2 Standard drinks or equivalent per week   Drug use: No    Allergies:  Allergies  Allergen Reactions   Bactroban  [Mupirocin] Hives   Bactrim [Sulfamethoxazole-Trimethoprim] Rash    Medications Prior to Admission  Medication Sig Dispense Refill Last Dose   butalbital-apap-caffeine-codeine (FIORICET WITH CODEINE) 50-325-40-30 MG capsule Take 1 capsule by mouth every 4 (four) hours as needed for headache.   Past Week   Prenatal Vit-Fe Fumarate-FA (PRENATAL MULTIVITAMIN) TABS tablet Take 1 tablet by mouth daily at 12 noon.   01/25/2023   clobetasol cream (TEMOVATE) 0.05 % Apply topically.      HYDROcodone-acetaminophen (NORCO/VICODIN) 5-325 MG tablet       ibuprofen (ADVIL) 800 MG tablet Take 800 mg 3 times a day by oral route.      levonorgestrel (MIRENA, 52 MG,)  20 MCG/24HR IUD 1 each by Intrauterine route once.      penicillin v potassium (VEETID) 500 MG tablet       predniSONE (STERAPRED UNI-PAK 21 TAB) 10 MG (21) TBPK tablet As directed on package (Patient not taking: Reported on 04/23/2020) 21 tablet 0    promethazine (PHENERGAN) 12.5 MG tablet Take 1 tablet (12.5 mg total) by mouth every 8 (eight) hours as needed for nausea or vomiting. (Patient not taking: Reported on 04/23/2020) 20 tablet 0    SUMAtriptan (IMITREX) 25 MG tablet Take 1 tablet (25 mg total) by mouth every 2 (two) hours as needed for migraine. May repeat in 2 hours if headache persists or recurs. No more than 100mg  in 24hrs (Patient not taking: Reported on 04/23/2020) 8 tablet 0     Review of Systems  Gastrointestinal:  Positive for abdominal pain.   Genitourinary:  Positive for vaginal discharge. Negative for vaginal bleeding.  Musculoskeletal:  Positive for back pain.   Physical Exam   Blood pressure 117/72, pulse 85, temperature 98.3 F (36.8 C), temperature source Oral, resp. rate 16, weight 107.3 kg, SpO2 100 %.   Physical Exam Constitutional:      General: She is not in acute distress. Genitourinary:     Vulva normal.     Genitourinary Comments: Pelvic exam: Copious thick white discharge present on speculum exam. No vaginal bleeding noted. Cervix visualized and 0cm, thick. No vaginal wall lesions noted.      Vaginal discharge present.  Cardiovascular:     Rate and Rhythm: Normal rate.  Musculoskeletal:     Right lower leg: No edema.     Left lower leg: No edema.  Neurological:     Mental Status: She is alert.  Psychiatric:        Mood and Affect: Mood normal.        Behavior: Behavior normal.  Vitals and nursing note reviewed. Exam conducted with a chaperone present.      MAU Course  Procedures  MDM 1035: Speculum exam performed by Dr. Lanae Crumbly as noted in physical exam. Pending fern test. 1143-tylenol helped with pain. Neg wet prep, but will treat for vaginal candidiasis given her symptoms.  Assessment and Plan  - Decreased fetal movement  Resolved on arrival  - Vaginal discharge Fern test negative. Wet prep as above.  Sent terconazole  - Abdominal/back pain Not experiencing intense pain, low suspicion for contraction pain. Tylenol 1 g given  Gave return precautions. Follow-up with primary OB  [name], DO 01/26/2023, 10:44 AM   ___ GME ATTESTATION:  Evaluation and management procedures were performed by the [facility] Medicine Resident under my supervision. I was immediately available for direct supervision, assistance and direction throughout this encounter.  I also confirm that I have verified the information documented in the resident's note, and that I have also personally reperformed  the pertinent components of the physical exam and all of the medical decision making activities.  I have also made any necessary editorial changes.  Myrtie Hawk, DO OB Fellow, Faculty [facility], Center for [facility] 01/26/2023 11:44 AM

## 2023-01-26 NOTE — MAU Note (Signed)
.  Kristine [name] is a 32 y.o. at [redacted]w[redacted]d here in MAU reporting: this morning around 0200 she got up to void, after she was finished she felt another gush of fluid come out. States she went back to bed and has continued to feel more gushes of clear watery fluid. Has also felt intermittent back and abdominal pain like a contraction. DFM today. Plans RCS, NPO: waffles at 0730 and water around 0800.   Pain score: 7 Vitals:   01/26/23 0955  BP: 135/71  Pulse: 88  Resp: 16  Temp: 98.3 F (36.8 C)  SpO2: 100%     FHT:156 Lab orders placed from triage:

## 2023-01-26 NOTE — MAU Note (Signed)
Patient reports fetal movements have returned to normal.

## 2023-01-27 ENCOUNTER — Encounter (HOSPITAL_COMMUNITY)
Admission: RE | Admit: 2023-01-27 | Discharge: 2023-01-27 | Disposition: A | Discharge status: Home / Self Care | Payer: BC Managed Care – PPO | Source: Ambulatory Visit | Attending: Obstetrics and Gynecology | Admitting: Obstetrics and Gynecology

## 2023-01-27 DIAGNOSIS — Z3493 Encounter for supervision of normal pregnancy, unspecified, third trimester: Secondary | ICD-10-CM | POA: Diagnosis not present

## 2023-01-27 DIAGNOSIS — O34219 Maternal care for unspecified type scar from previous cesarean delivery: Secondary | ICD-10-CM | POA: Diagnosis not present

## 2023-01-27 DIAGNOSIS — O34211 Maternal care for low transverse scar from previous cesarean delivery: Secondary | ICD-10-CM | POA: Diagnosis not present

## 2023-01-27 DIAGNOSIS — Z8614 Personal history of Methicillin resistant Staphylococcus aureus infection: Secondary | ICD-10-CM | POA: Diagnosis not present

## 2023-01-27 DIAGNOSIS — Z3A00 Weeks of gestation of pregnancy not specified: Secondary | ICD-10-CM | POA: Diagnosis not present

## 2023-01-27 DIAGNOSIS — Z3A39 39 weeks gestation of pregnancy: Secondary | ICD-10-CM | POA: Diagnosis not present

## 2023-01-27 DIAGNOSIS — O99214 Obesity complicating childbirth: Secondary | ICD-10-CM | POA: Diagnosis not present

## 2023-01-27 DIAGNOSIS — O26893 Other specified pregnancy related conditions, third trimester: Secondary | ICD-10-CM | POA: Diagnosis not present

## 2023-01-27 DIAGNOSIS — Z6791 Unspecified blood type, Rh negative: Secondary | ICD-10-CM | POA: Diagnosis not present

## 2023-01-27 DIAGNOSIS — D62 Acute posthemorrhagic anemia: Secondary | ICD-10-CM | POA: Diagnosis not present

## 2023-01-27 DIAGNOSIS — Z3490 Encounter for supervision of normal pregnancy, unspecified, unspecified trimester: Secondary | ICD-10-CM

## 2023-01-27 DIAGNOSIS — O9081 Anemia of the puerperium: Secondary | ICD-10-CM | POA: Diagnosis not present

## 2023-01-27 DIAGNOSIS — Z23 Encounter for immunization: Secondary | ICD-10-CM | POA: Diagnosis not present

## 2023-01-27 LAB — CBC
HCT: 33 % — ABNORMAL LOW (ref 36.0–46.0)
Hemoglobin: 10.6 g/dL — ABNORMAL LOW (ref 12.0–15.0)
MCH: 31.1 pg (ref 26.0–34.0)
MCHC: 32.1 g/dL (ref 30.0–36.0)
MCV: 96.8 fL (ref 80.0–100.0)
Platelets: 200 10*3/uL (ref 150–400)
RBC: 3.41 MIL/uL — ABNORMAL LOW (ref 3.87–5.11)
RDW: 13 % (ref 11.5–15.5)
WBC: 7.5 10*3/uL (ref 4.0–10.5)
nRBC: 0 % (ref 0.0–0.2)

## 2023-01-27 LAB — RPR: RPR Ser Ql: NONREACTIVE

## 2023-01-27 LAB — TYPE AND SCREEN
ABO/RH(D): A NEG
Antibody Screen: POSITIVE

## 2023-01-29 DIAGNOSIS — Z3490 Encounter for supervision of normal pregnancy, unspecified, unspecified trimester: Secondary | ICD-10-CM

## 2023-01-29 NOTE — Anesthesia Preprocedure Evaluation (Signed)
Anesthesia Evaluation  Patient identified by MRN, date of birth, ID band Patient awake    Reviewed: Allergy & Precautions, Patient's Chart, lab work & pertinent test results  Airway Mallampati: II  TM Distance: >3 FB Neck ROM: Full    Dental no notable dental hx. (+) Teeth Intact, Dental Advisory Given   Pulmonary neg pulmonary ROS   Pulmonary exam normal breath sounds clear to auscultation       Cardiovascular negative cardio ROS Normal cardiovascular exam Rhythm:Regular Rate:Normal     Neuro/Psych negative neurological ROS  negative psych ROS   GI/Hepatic negative GI ROS, Neg liver ROS,,,  Endo/Other  negative endocrine ROS    Renal/GU negative Renal ROS     Musculoskeletal negative musculoskeletal ROS (+)    Abdominal  (+) + obese (BMI 44.71)  Peds  (+) Delivery details - Hematology negative hematology ROS (+) Lab Results      Component                Value               Date                      WBC                      7.5                 01/27/2023                HGB                      10.6 (L)            01/27/2023                HCT                      33.0 (L)            01/27/2023                MCV                      96.8                01/27/2023                PLT                      200                 01/27/2023              Anesthesia Other Findings ALL: bactrim  Reproductive/Obstetrics (+) Pregnancy                             Anesthesia Physical Anesthesia Plan  ASA: 3  Anesthesia Plan: Spinal   Post-op Pain Management: Ofirmev IV (intra-op)* and Toradol IV (intra-op)*   Induction:   PONV Risk Score and Plan: Treatment may vary due to age or medical condition, Ondansetron and Dexamethasone  Airway Management Planned: Natural Airway and Nasal Cannula  Additional Equipment: None  Intra-op Plan:   Post-operative Plan:   Informed Consent: I have  reviewed the patients History and Physical, chart, labs and discussed the procedure including the risks, benefits  and alternatives for the proposed anesthesia with the patient or authorized representative who has indicated his/her understanding and acceptance.     Dental advisory given  Plan Discussed with: CRNA and Anesthesiologist  Anesthesia Plan Comments:        Anesthesia Quick Evaluation

## 2023-01-30 ENCOUNTER — Encounter (HOSPITAL_COMMUNITY)
Admission: RE | Disposition: A | Discharge status: Home / Self Care | Payer: Self-pay | Source: Home / Self Care | Attending: Obstetrics and Gynecology

## 2023-01-30 ENCOUNTER — Inpatient Hospital Stay (HOSPITAL_COMMUNITY): Payer: BC Managed Care – PPO | Admitting: Obstetrics and Gynecology

## 2023-01-30 ENCOUNTER — Other Ambulatory Visit: Payer: Self-pay

## 2023-01-30 ENCOUNTER — Inpatient Hospital Stay (HOSPITAL_COMMUNITY)
Admission: RE | Admit: 2023-01-30 | Discharge: 2023-02-01 | DRG: 787 | Disposition: A | Discharge status: Home / Self Care | Payer: BC Managed Care – PPO | Attending: Obstetrics and Gynecology | Admitting: Obstetrics and Gynecology

## 2023-01-30 ENCOUNTER — Encounter (HOSPITAL_COMMUNITY): Payer: Self-pay | Admitting: Obstetrics and Gynecology

## 2023-01-30 DIAGNOSIS — D62 Acute posthemorrhagic anemia: Secondary | ICD-10-CM | POA: Diagnosis not present

## 2023-01-30 DIAGNOSIS — O9081 Anemia of the puerperium: Secondary | ICD-10-CM | POA: Diagnosis not present

## 2023-01-30 DIAGNOSIS — Z98891 History of uterine scar from previous surgery: Secondary | ICD-10-CM

## 2023-01-30 DIAGNOSIS — O26893 Other specified pregnancy related conditions, third trimester: Secondary | ICD-10-CM | POA: Diagnosis present

## 2023-01-30 DIAGNOSIS — Z6791 Unspecified blood type, Rh negative: Secondary | ICD-10-CM

## 2023-01-30 DIAGNOSIS — O34211 Maternal care for low transverse scar from previous cesarean delivery: Principal | ICD-10-CM | POA: Diagnosis present

## 2023-01-30 DIAGNOSIS — Z8614 Personal history of Methicillin resistant Staphylococcus aureus infection: Secondary | ICD-10-CM | POA: Diagnosis not present

## 2023-01-30 DIAGNOSIS — Z3490 Encounter for supervision of normal pregnancy, unspecified, unspecified trimester: Principal | ICD-10-CM

## 2023-01-30 DIAGNOSIS — Z3493 Encounter for supervision of normal pregnancy, unspecified, third trimester: Secondary | ICD-10-CM | POA: Diagnosis not present

## 2023-01-30 DIAGNOSIS — O99214 Obesity complicating childbirth: Secondary | ICD-10-CM | POA: Diagnosis present

## 2023-01-30 DIAGNOSIS — Z3A39 39 weeks gestation of pregnancy: Secondary | ICD-10-CM

## 2023-01-30 DIAGNOSIS — Z23 Encounter for immunization: Secondary | ICD-10-CM

## 2023-01-30 HISTORY — DX: History of uterine scar from previous surgery: Z98.891

## 2023-01-30 SURGERY — Surgical Case
Anesthesia: Spinal

## 2023-01-30 MED ORDER — SOD CITRATE-CITRIC ACID 500-334 MG/5ML PO SOLN
30.0000 mL | ORAL | Status: AC
  Administered 2023-01-30: 30 mL via ORAL

## 2023-01-30 MED ORDER — SCOPOLAMINE 1 MG/3DAYS TD PT72
1.0000 | MEDICATED_PATCH | Freq: Once | TRANSDERMAL | Status: DC
  Administered 2023-01-30: 1.5 mg via TRANSDERMAL

## 2023-01-30 MED ORDER — SCOPOLAMINE 1 MG/3DAYS TD PT72
MEDICATED_PATCH | TRANSDERMAL | Status: AC
  Filled 2023-01-30: qty 1

## 2023-01-30 MED ORDER — OXYTOCIN-SODIUM CHLORIDE 30-0.9 UT/500ML-% IV SOLN
INTRAVENOUS | Status: AC
  Filled 2023-01-30: qty 500

## 2023-01-30 MED ORDER — IBUPROFEN 600 MG PO TABS
600.0000 mg | ORAL_TABLET | Freq: Four times a day (QID) | ORAL | Status: DC
  Administered 2023-01-30 – 2023-02-01 (×7): 600 mg via ORAL
  Filled 2023-01-30 (×7): qty 1

## 2023-01-30 MED ORDER — KETOROLAC TROMETHAMINE 30 MG/ML IJ SOLN
INTRAMUSCULAR | Status: DC | PRN
  Administered 2023-01-30: 30 mg via INTRAVENOUS

## 2023-01-30 MED ORDER — WITCH HAZEL-GLYCERIN EX PADS: 1.0000 | MEDICATED_PAD | CUTANEOUS | Status: DC | PRN

## 2023-01-30 MED ORDER — DIPHENHYDRAMINE HCL 50 MG/ML IJ SOLN: 12.5000 mg | INTRAMUSCULAR | Status: DC | PRN

## 2023-01-30 MED ORDER — MORPHINE SULFATE (PF) 0.5 MG/ML IJ SOLN
INTRAMUSCULAR | Status: DC | PRN
  Administered 2023-01-30: 150 ug via INTRATHECAL

## 2023-01-30 MED ORDER — BUPIVACAINE IN DEXTROSE 0.75-8.25 % IT SOLN
INTRATHECAL | Status: DC | PRN
  Administered 2023-01-30: 12 mg via INTRATHECAL

## 2023-01-30 MED ORDER — FERROUS SULFATE 325 (65 FE) MG PO TABS
325.0000 mg | ORAL_TABLET | ORAL | Status: DC
  Administered 2023-01-31: 325 mg via ORAL
  Filled 2023-01-30: qty 1

## 2023-01-30 MED ORDER — CEFAZOLIN SODIUM-DEXTROSE 2-4 GM/100ML-% IV SOLN
INTRAVENOUS | Status: AC
  Filled 2023-01-30: qty 100

## 2023-01-30 MED ORDER — RHO D IMMUNE GLOBULIN 1500 UNIT/2ML IJ SOSY
300.0000 ug | PREFILLED_SYRINGE | Freq: Once | INTRAMUSCULAR | Status: AC
  Administered 2023-01-31: 300 ug via INTRAVENOUS
  Filled 2023-01-30: qty 2

## 2023-01-30 MED ORDER — ACETAMINOPHEN 10 MG/ML IV SOLN
INTRAVENOUS | Status: AC
  Filled 2023-01-30: qty 100

## 2023-01-30 MED ORDER — DIPHENHYDRAMINE HCL 25 MG PO CAPS: 25.0000 mg | ORAL_CAPSULE | ORAL | Status: DC | PRN

## 2023-01-30 MED ORDER — HYDROMORPHONE HCL 1 MG/ML IJ SOLN: 0.2500 mg | INTRAMUSCULAR | Status: DC | PRN

## 2023-01-30 MED ORDER — OXYCODONE HCL 5 MG PO TABS
5.0000 mg | ORAL_TABLET | ORAL | Status: DC | PRN
  Filled 2023-01-30: qty 2

## 2023-01-30 MED ORDER — SODIUM CHLORIDE 0.9% FLUSH: 3.0000 mL | INTRAVENOUS | Status: DC | PRN

## 2023-01-30 MED ORDER — NALOXONE HCL 0.4 MG/ML IJ SOLN: 0.4000 mg | INTRAMUSCULAR | Status: DC | PRN

## 2023-01-30 MED ORDER — OXYTOCIN-SODIUM CHLORIDE 30-0.9 UT/500ML-% IV SOLN
INTRAVENOUS | Status: DC | PRN
  Administered 2023-01-30: 30 [IU] via INTRAVENOUS

## 2023-01-30 MED ORDER — ACETAMINOPHEN 500 MG PO TABS: 1000.0000 mg | ORAL_TABLET | ORAL | Status: DC

## 2023-01-30 MED ORDER — CEFAZOLIN SODIUM-DEXTROSE 2-4 GM/100ML-% IV SOLN
2.0000 g | INTRAVENOUS | Status: AC
  Administered 2023-01-30: 2 g via INTRAVENOUS

## 2023-01-30 MED ORDER — KETOROLAC TROMETHAMINE 30 MG/ML IJ SOLN: 30.0000 mg | Freq: Once | INTRAMUSCULAR | Status: DC | PRN

## 2023-01-30 MED ORDER — SENNOSIDES-DOCUSATE SODIUM 8.6-50 MG PO TABS
2.0000 | ORAL_TABLET | Freq: Every day | ORAL | Status: DC
  Administered 2023-01-31 – 2023-02-01 (×2): 2 via ORAL
  Filled 2023-01-30 (×2): qty 2

## 2023-01-30 MED ORDER — OXYCODONE HCL 5 MG PO TABS: 5.0000 mg | ORAL_TABLET | Freq: Once | ORAL | Status: DC | PRN

## 2023-01-30 MED ORDER — MAGNESIUM HYDROXIDE 400 MG/5ML PO SUSP: 30.0000 mL | ORAL | Status: DC | PRN

## 2023-01-30 MED ORDER — MENTHOL 3 MG MT LOZG: 1.0000 | LOZENGE | OROMUCOSAL | Status: DC | PRN

## 2023-01-30 MED ORDER — ONDANSETRON HCL 4 MG/2ML IJ SOLN: 4.0000 mg | Freq: Once | INTRAMUSCULAR | Status: DC | PRN

## 2023-01-30 MED ORDER — ACETAMINOPHEN 500 MG PO TABS
1000.0000 mg | ORAL_TABLET | Freq: Four times a day (QID) | ORAL | Status: DC
  Administered 2023-01-30 – 2023-02-01 (×6): 1000 mg via ORAL
  Filled 2023-01-30 (×6): qty 2

## 2023-01-30 MED ORDER — POVIDONE-IODINE 10 % EX SWAB
2.0000 | Freq: Once | CUTANEOUS | Status: AC
  Administered 2023-01-30: 2 via TOPICAL

## 2023-01-30 MED ORDER — DEXAMETHASONE SODIUM PHOSPHATE 4 MG/ML IJ SOLN
INTRAMUSCULAR | Status: AC
  Filled 2023-01-30: qty 2

## 2023-01-30 MED ORDER — ONDANSETRON HCL 4 MG/2ML IJ SOLN
INTRAMUSCULAR | Status: DC | PRN
  Administered 2023-01-30: 4 mg via INTRAVENOUS

## 2023-01-30 MED ORDER — FENTANYL CITRATE (PF) 100 MCG/2ML IJ SOLN
INTRAMUSCULAR | Status: AC
  Filled 2023-01-30: qty 2

## 2023-01-30 MED ORDER — FENTANYL CITRATE (PF) 100 MCG/2ML IJ SOLN
INTRAMUSCULAR | Status: DC | PRN
  Administered 2023-01-30: 15 ug via INTRATHECAL

## 2023-01-30 MED ORDER — PRENATAL MULTIVITAMIN CH
1.0000 | ORAL_TABLET | Freq: Every day | ORAL | Status: DC
  Administered 2023-02-01: 1 via ORAL
  Filled 2023-01-30: qty 1

## 2023-01-30 MED ORDER — EPHEDRINE SULFATE-NACL 50-0.9 MG/10ML-% IV SOSY
PREFILLED_SYRINGE | INTRAVENOUS | Status: DC | PRN
  Administered 2023-01-30: 5 mg via INTRAVENOUS

## 2023-01-30 MED ORDER — EPHEDRINE 5 MG/ML INJ
INTRAVENOUS | Status: AC
  Filled 2023-01-30: qty 5

## 2023-01-30 MED ORDER — SIMETHICONE 80 MG PO CHEW: 80.0000 mg | CHEWABLE_TABLET | ORAL | Status: DC | PRN

## 2023-01-30 MED ORDER — ZOLPIDEM TARTRATE 5 MG PO TABS: 5.0000 mg | ORAL_TABLET | Freq: Every evening | ORAL | Status: DC | PRN

## 2023-01-30 MED ORDER — OXYCODONE HCL 5 MG/5ML PO SOLN: 5.0000 mg | Freq: Once | ORAL | Status: DC | PRN

## 2023-01-30 MED ORDER — DIBUCAINE (PERIANAL) 1 % EX OINT: 1.0000 | TOPICAL_OINTMENT | CUTANEOUS | Status: DC | PRN

## 2023-01-30 MED ORDER — ACETAMINOPHEN 10 MG/ML IV SOLN
INTRAVENOUS | Status: DC | PRN
  Administered 2023-01-30: 1000 mg via INTRAVENOUS

## 2023-01-30 MED ORDER — COCONUT OIL OIL: 1.0000 | TOPICAL_OIL | Status: DC | PRN

## 2023-01-30 MED ORDER — FERROUS SULFATE 325 (65 FE) MG PO TABS: 325.0000 mg | ORAL_TABLET | ORAL | Status: DC

## 2023-01-30 MED ORDER — MORPHINE SULFATE (PF) 0.5 MG/ML IJ SOLN
INTRAMUSCULAR | Status: AC
  Filled 2023-01-30: qty 10

## 2023-01-30 MED ORDER — ONDANSETRON HCL 4 MG/2ML IJ SOLN: 4.0000 mg | Freq: Three times a day (TID) | INTRAMUSCULAR | Status: DC | PRN

## 2023-01-30 MED ORDER — SOD CITRATE-CITRIC ACID 500-334 MG/5ML PO SOLN
ORAL | Status: AC
  Filled 2023-01-30: qty 30

## 2023-01-30 MED ORDER — ONDANSETRON HCL 4 MG/2ML IJ SOLN
INTRAMUSCULAR | Status: AC
  Filled 2023-01-30: qty 2

## 2023-01-30 MED ORDER — DEXAMETHASONE SODIUM PHOSPHATE 4 MG/ML IJ SOLN
INTRAMUSCULAR | Status: DC | PRN
  Administered 2023-01-30: 4 mg via INTRAVENOUS

## 2023-01-30 MED ORDER — MEPERIDINE HCL 25 MG/ML IJ SOLN: 6.2500 mg | INTRAMUSCULAR | Status: DC | PRN

## 2023-01-30 MED ORDER — KETOROLAC TROMETHAMINE 30 MG/ML IJ SOLN
INTRAMUSCULAR | Status: AC
  Filled 2023-01-30: qty 1

## 2023-01-30 MED ORDER — OXYTOCIN-SODIUM CHLORIDE 30-0.9 UT/500ML-% IV SOLN: 2.5000 [IU]/h | INTRAVENOUS | Status: AC

## 2023-01-30 MED ORDER — TRANEXAMIC ACID-NACL 1000-0.7 MG/100ML-% IV SOLN
INTRAVENOUS | Status: DC | PRN
  Administered 2023-01-30: 1000 mg via INTRAVENOUS

## 2023-01-30 MED ORDER — PHENYLEPHRINE HCL-NACL 20-0.9 MG/250ML-% IV SOLN
INTRAVENOUS | Status: DC | PRN
  Administered 2023-01-30: 60 ug/min via INTRAVENOUS

## 2023-01-30 MED ORDER — DIPHENHYDRAMINE HCL 25 MG PO CAPS: 25.0000 mg | ORAL_CAPSULE | Freq: Four times a day (QID) | ORAL | Status: DC | PRN

## 2023-01-30 MED ORDER — SIMETHICONE 80 MG PO CHEW
80.0000 mg | CHEWABLE_TABLET | Freq: Three times a day (TID) | ORAL | Status: DC
  Administered 2023-01-31 – 2023-02-01 (×4): 80 mg via ORAL
  Filled 2023-01-30 (×4): qty 1

## 2023-01-30 MED ORDER — NALOXONE HCL 4 MG/10ML IJ SOLN: 1.0000 ug/kg/h | INTRAVENOUS | Status: DC | PRN

## 2023-01-30 MED ORDER — TRANEXAMIC ACID-NACL 1000-0.7 MG/100ML-% IV SOLN
INTRAVENOUS | Status: AC
  Filled 2023-01-30: qty 100

## 2023-01-30 MED ORDER — LACTATED RINGERS IV SOLN: INTRAVENOUS | Status: DC

## 2023-01-30 SURGICAL SUPPLY — 39 items
APL PRP STRL LF DISP 70% ISPRP (MISCELLANEOUS) ×2
APL SKNCLS STERI-STRIP NONHPOA (GAUZE/BANDAGES/DRESSINGS) ×1
BENZOIN TINCTURE PRP APPL 2/3 (GAUZE/BANDAGES/DRESSINGS) IMPLANT
CHLORAPREP W/TINT 26 (MISCELLANEOUS) ×2 IMPLANT
CLAMP UMBILICAL CORD (MISCELLANEOUS) ×1 IMPLANT
CLOTH BEACON ORANGE TIMEOUT ST (SAFETY) ×1 IMPLANT
DRAPE C SECTION CLR SCREEN (DRAPES) ×1 IMPLANT
DRSG OPSITE POSTOP 4X10 (GAUZE/BANDAGES/DRESSINGS) ×1 IMPLANT
ELECT REM PT RETURN 9FT ADLT (ELECTROSURGICAL) ×1
ELECTRODE REM PT RTRN 9FT ADLT (ELECTROSURGICAL) ×1 IMPLANT
EXTRACTOR VACUUM KIWI (MISCELLANEOUS) IMPLANT
GAUZE SPONGE 4X4 12PLY STRL LF (GAUZE/BANDAGES/DRESSINGS) IMPLANT
GLOVE BIO SURGEON STRL SZ 6.5 (GLOVE) ×1 IMPLANT
GLOVE BIOGEL PI IND STRL 7.0 (GLOVE) ×2 IMPLANT
GOWN STRL REUS W/TWL LRG LVL3 (GOWN DISPOSABLE) ×2 IMPLANT
KIT ABG SYR 3ML LUER SLIP (SYRINGE) IMPLANT
MAT PREVALON FULL STRYKER (MISCELLANEOUS) IMPLANT
NDL HYPO 25X5/8 SAFETYGLIDE (NEEDLE) IMPLANT
NEEDLE HYPO 25X5/8 SAFETYGLIDE (NEEDLE) IMPLANT
NS IRRIG 1000ML POUR BTL (IV SOLUTION) ×1 IMPLANT
PACK C SECTION WH (CUSTOM PROCEDURE TRAY) ×1 IMPLANT
PAD OB MATERNITY 4.3X12.25 (PERSONAL CARE ITEMS) ×1 IMPLANT
RETRACTOR WND ALEXIS 25 LRG (MISCELLANEOUS) ×1 IMPLANT
RTRCTR C-SECT PINK 25CM LRG (MISCELLANEOUS) IMPLANT
RTRCTR WOUND ALEXIS 25CM LRG (MISCELLANEOUS) ×1
STRIP CLOSURE SKIN 1/2X4 (GAUZE/BANDAGES/DRESSINGS) IMPLANT
SUT CHROMIC 1 CTX 36 (SUTURE) ×2 IMPLANT
SUT PLAIN 0 NONE (SUTURE) IMPLANT
SUT PLAIN 2 0 XLH (SUTURE) ×1 IMPLANT
SUT VIC AB 0 CT1 27 (SUTURE) ×2
SUT VIC AB 0 CT1 27XBRD ANBCTR (SUTURE) ×2 IMPLANT
SUT VIC AB 2-0 CT1 27 (SUTURE) ×1
SUT VIC AB 2-0 CT1 TAPERPNT 27 (SUTURE) ×1 IMPLANT
SUT VIC AB 3-0 CT1 27 (SUTURE)
SUT VIC AB 3-0 CT1 TAPERPNT 27 (SUTURE) IMPLANT
SUT VIC AB 4-0 KS 27 (SUTURE) ×1 IMPLANT
TOWEL OR 17X24 6PK STRL BLUE (TOWEL DISPOSABLE) ×1 IMPLANT
TRAY FOLEY W/BAG SLVR 14FR LF (SET/KITS/TRAYS/PACK) ×1 IMPLANT
WATER STERILE IRR 1000ML POUR (IV SOLUTION) ×1 IMPLANT

## 2023-01-30 NOTE — H&P (Signed)
Kristine [name] is a 32 y.o.G2P1001 female presenting at 36 1/7wks for elective repeat c/s. She is dated per LMP which was confirmed by a 10week [place]. Her pregnancy has been complicated by History of previous c/s for arrest of dil- opted for elective repeat c/s Rh negative- received rhogam at 28 weeks Maternal obesity - bmi 35 She is GBS neg. Interitest and NIPT in expected range.  OB History     Gravida  2   Para  1   Term  1   Preterm      AB      Living  1      SAB      IAB      Ectopic      Multiple  0   Live Births  1          Past Medical History:  Diagnosis Date   MRSA infection 2017   right upper thigh   S/P cesarean section 01/06/2016   STD (sexually transmitted disease) 03/2014   Chlamydia   Past Surgical History:  Procedure Laterality Date   BONE EXCISION Left 08/18/2018   Procedure: Excision of Accessory Navicular;  Surgeon: Toni Arthurs, MD;  Location: [facility];  Service: Orthopedics;  Laterality: Left;   CESAREAN SECTION N/A 01/06/2016   Procedure: CESAREAN SECTION;  Surgeon: Sherian Rein, MD;  Location: [facility];  Service: Obstetrics;  Laterality: N/A;   GASTROCNEMIUS RECESSION Left 08/18/2018   Procedure: Left gastroc recession;  Surgeon: Toni Arthurs, MD;  Location: [facility];  Service: Orthopedics;  Laterality: Left;   TENDON TRANSFER Left 08/18/2018   Procedure: Transfer of Posterior Tibial Tendon;  Surgeon: Toni Arthurs, MD;  Location: [facility];  Service: Orthopedics;  Laterality: Left;   WISDOM TOOTH EXTRACTION     Family History: family history includes Heart disease in her maternal grandmother; Hypertension in her mother; Multiple sclerosis in her father. Social History:  reports that she has never smoked. She has never used smokeless tobacco. She reports that she does not currently use alcohol after a past usage of about 2.0 standard drinks of alcohol per week. She reports that  she does not use drugs.     Maternal Diabetes: No Genetic Screening: Normal Maternal Ultrasounds/Referrals: Normal Fetal Ultrasounds or other Referrals:  None Maternal Substance Abuse:  No Significant Maternal Medications:  None Significant Maternal Lab Results:  Group B Strep negative Number of Prenatal Visits:greater than 3 verified prenatal visits Other Comments:  None  Review of Systems  Constitutional:  Positive for fatigue. Negative for activity change.  Eyes:  Negative for photophobia and visual disturbance.  Respiratory:  Negative for chest tightness and shortness of breath.   Cardiovascular:  Positive for leg swelling. Negative for chest pain and palpitations.  Gastrointestinal:  Negative for abdominal pain.  Genitourinary:  Positive for pelvic pain.  Musculoskeletal:  Negative for back pain.  Neurological:  Negative for seizures, light-headedness and headaches.  Psychiatric/Behavioral:  The patient is nervous/anxious.    Maternal Medical History:  Reason for admission: Scheduled elective repeat c/s planned   Contractions: Frequency: rare.   Fetal activity: Perceived fetal activity is normal.   Prenatal complications: no prenatal complications Prenatal Complications - Diabetes: none.     Blood pressure (!) 125/96, temperature 98.1 F (36.7 C), temperature source Oral, resp. rate 18, height 5\' 1"  (1.549 m), weight 107 kg. Maternal Exam:  Uterine Assessment: Contraction strength is mild.  Contraction frequency is rare.  Abdomen:  Patient reports generalized tenderness.  Estimated fetal weight is AGA.   Fetal presentation: vertex Introitus: Normal vulva. Vulva is negative for condylomata.  Vagina is negative for condylomata.  Cervix: Cervix evaluated by digital exam.     Fetal Exam Fetal Monitor Review: Baseline rate: 140.  Variability: moderate (6-25 bpm).   Pattern: accelerations present and no decelerations.   Fetal State Assessment: Category I - tracings  are normal.   Physical Exam Vitals and nursing note reviewed. Exam conducted with a chaperone present.  Constitutional:      Appearance: Normal appearance. She is obese.  Cardiovascular:     Rate and Rhythm: Normal rate.     Pulses: Normal pulses.  Pulmonary:     Effort: Pulmonary effort is normal.  Abdominal:     Tenderness: There is generalized abdominal tenderness.  Genitourinary:    General: Normal vulva.  Musculoskeletal:        General: Normal range of motion.     Cervical back: Normal range of motion.  Skin:    General: Skin is warm.     Capillary Refill: Capillary refill takes 2 to 3 seconds.  Neurological:     General: No focal deficit present.     Mental Status: She is alert and oriented to person, place, and time. Mental status is at baseline.  Psychiatric:        Mood and Affect: Mood normal.        Behavior: Behavior normal.        Thought Content: Thought content normal.        Judgment: Judgment normal.     Prenatal labs: ABO, Rh: --/--/A NEG (07/03 0949) Antibody: POS (07/03 0949) Rubella: Immune (12/18 0000) RPR: NON REACTIVE (07/03 0944)  HBsAg: Negative (12/18 0000)  HIV: Non-reactive (12/18 0000)  GBS:     Assessment/Plan: 31yo G2P1001 female at 5 1/7wks for elective repeat c/s - Admit  - FHTs confirmed  - ERAS protocol  - Pain control prn  - Consent obtained after expectations explained - To OR when ready    Kristine [name] 01/30/2023, 10:41 AM

## 2023-01-30 NOTE — Transfer of Care (Signed)
Immediate Anesthesia Transfer of Care Note  Patient: Kristine [name]  Procedure(s) Performed: CESAREAN SECTION  Patient Location: PACU  Anesthesia Type:Spinal  Level of Consciousness: awake, alert , and oriented  Airway & Oxygen Therapy: Patient Spontanous Breathing  Post-op Assessment: Report given to RN and Post -op Vital signs reviewed and stable  Post vital signs: Reviewed and stable  Last Vitals:  Vitals Value Taken Time  BP 129/80 01/30/23 1340  Temp    Pulse 73 01/30/23 1344  Resp 13 01/30/23 1344  SpO2 100 % 01/30/23 1344  Vitals shown include unvalidated device data.  Last Pain:  Vitals:   01/30/23 0929  TempSrc: Oral  PainSc:          Complications: No notable events documented.

## 2023-01-30 NOTE — Anesthesia Procedure Notes (Signed)
Spinal  Patient location during procedure: OB Start time: 01/30/2023 12:24 PM End time: 01/30/2023 12:27 PM Reason for block: surgical anesthesia Staffing Performed: anesthesiologist  Anesthesiologist: Trevor Iha, MD Performed by: Trevor Iha, MD Authorized by: Trevor Iha, MD   Preanesthetic Checklist Completed: patient identified, IV checked, risks and benefits discussed, surgical consent, monitors and equipment checked, pre-op evaluation and timeout performed Spinal Block Patient position: sitting Prep: DuraPrep and site prepped and draped Patient monitoring: heart rate, cardiac monitor, continuous pulse ox and blood pressure Approach: midline Location: L4-5 Injection technique: single-shot Needle Needle type: Pencan  Needle gauge: 24 G Needle length: 10 cm Needle insertion depth: 6 cm Assessment Sensory level: T4 Events: CSF return Additional Notes  Attempt (s). Pt tolerated procedure well.

## 2023-01-30 NOTE — Progress Notes (Signed)
Patient ID: Kristine [name], female   DOB: [birth year], 32 y.o.   MRN: [id number] Called by nurse while in a delivery. When I called back she informed me that pts' urine output was over past 4hrs. She admitted that pt has been nauseated thus not drinking fluids however that has sinec resolved and pt was just able to drink of fluid.  Pt otherwise well.   I advised RN to have output monitored over next two hours and call me with an update.

## 2023-01-30 NOTE — Anesthesia Postprocedure Evaluation (Signed)
Anesthesia Post Note  Patient: Kristine [name]  Procedure(s) Performed: CESAREAN SECTION     Patient location during evaluation: Mother Baby Anesthesia Type: Spinal Level of consciousness: oriented and awake and alert Pain management: pain level controlled Vital Signs Assessment: post-procedure vital signs reviewed and stable Respiratory status: spontaneous breathing and respiratory function stable Cardiovascular status: blood pressure returned to baseline and stable Postop Assessment: no headache, no backache, no apparent nausea or vomiting and able to ambulate Anesthetic complications: no   No notable events documented.  Last Vitals:  Vitals:   01/30/23 1530 01/30/23 1629  BP: 123/65 112/67  Pulse: 60 61  Resp: 20 18  Temp: (!) 35.9 C (!) 35.6 C  SpO2: 100% 100%    Last Pain:  Vitals:   01/30/23 1630  TempSrc:   PainSc: 0-No pain   Pain Goal:                Epidural/Spinal Function Cutaneous sensation: Tingles (01/30/23 1630), Patient able to flex knees: Yes (01/30/23 1630), Patient able to lift hips off bed: Yes (01/30/23 1630), Back pain beyond tenderness at insertion site: No (01/30/23 1630), Progressively worsening motor and/or sensory loss: No (01/30/23 1630), Bowel and/or bladder incontinence post epidural: No (01/30/23 1630)  Trevor Iha

## 2023-01-30 NOTE — Op Note (Signed)
Operative Note    Preoperative Diagnosis IUP at 39 1/7wks  Previous cesarean section  Declines TOLAC  Postoperative Diagnosis: Same   Procedure: Repeat low transverse cesarean section with no extensions; double layered closure.    Surgeon: Britt Bottom, DO    Anesthesia: Spinal   Fluids: LR EBL: UOP:  Findings: Viable female infant in vertex position, APGARS 8,9; weight pending. Grossly normal uterus, tubes and ovaries    Specimen: Placenta to L/D   Procedure Note Consent verified pre-op. All questions answered   Patient was taken to the operating room where spinal anesthesia was administered. She was prepped and draped in the normal sterile fashion and placed in the dorsal supine position with a leftward tilt. An appropriate time out was performed.  A Pfannenstiel skin incision was then made through the previous incision with the scalpel and carried through to the underlying layer of fascia by sharp dissection and Bovie cautery. The fascia was nicked in the midline and the incision was extended laterally with Mayo scissors. The superior and inferior aspects of the incision were grasped with kocher clamps and dissected off the underlying rectus muscles. Rectus muscles were separated in the midline  and the peritoneal cavity entered bluntly. The peritoneal incision was then extended both superiorly and inferiorly with careful attention to avoid both bowel and bladder.  The Alexis self-retaining wound retractor was then placed and the lower uterine segment exposed. The bladder flap was developed with Metzenbaum scissors and pushed away from the lower uterine segment. The lower uterine segment was then incised in a transverse fashion and the cavity itself entered bluntly. The incision was extended bluntly. The infant's head was then lifted and delivered from the incision without difficulty. Vigorous spontaneous cry was noted during delivery.  The remainder of the infant  delivered easily and the nose and mouth bulb were bulb suctioned.  The cord was clamped and cut after a minute delay. The infant was handed off to the waiting NICU team. The placenta was then spontaneously expressed from the uterus and the uterus cleared of all clots and debris with moist lap sponge. Trailing membranes removed. The uterine incision was then repaired in 2 layers the first layer was a running locked layer 1-0 chromic and the second an imbricating layer of the same suture. The tubes and ovaries were inspected and the gutters cleared of all clots and debris. The uterine incision was inspected and found to be hemostatic.  All instruments and sponges were then removed from the abdomen. The peritoneum was then reapproximated in a running fashion with sutures of 2-0 Vicryl.  The fascia was then closed with 0 Vicryl in a running fashion.  The skin was closed with a subcuticular stitch of 4-0 Vicryl on a Keith needle and then reinforced with benzoin and Steri-Strips. At the conclusion of the procedure all instruments and sponge counts were correct. Patient was taken to the recovery room in good condition with her baby accompanying her skin to skin.  Apgars 9,9

## 2023-01-31 LAB — RH IG WORKUP (INCLUDES ABO/RH): Gestational Age(Wks): 39.1

## 2023-01-31 LAB — CBC
HCT: 28 % — ABNORMAL LOW (ref 36.0–46.0)
Hemoglobin: 9.4 g/dL — ABNORMAL LOW (ref 12.0–15.0)
MCH: 30.8 pg (ref 26.0–34.0)
MCHC: 33.6 g/dL (ref 30.0–36.0)
MCV: 91.8 fL (ref 80.0–100.0)
Platelets: 167 10*3/uL (ref 150–400)
RBC: 3.05 MIL/uL — ABNORMAL LOW (ref 3.87–5.11)
RDW: 12.9 % (ref 11.5–15.5)
WBC: 13.3 10*3/uL — ABNORMAL HIGH (ref 4.0–10.5)
nRBC: 0 % (ref 0.0–0.2)

## 2023-01-31 MED ORDER — FERROUS SULFATE 325 (65 FE) MG PO TABS: 325.0000 mg | ORAL_TABLET | Freq: Every day | ORAL | Status: DC

## 2023-01-31 NOTE — Lactation Note (Signed)
This note was copied from a baby's chart. Lactation Consultation Note  Patient Name: Boy Hendy Sonne [id number] Date: 01/31/2023 Age:32 hours, P 2  Reason for consult: Initial assessment;Term;Nipple pain/trauma (5 % weight loss, post circ. pe rmom last attempted at 10:30 am  after circ .) LC  offered to assist to latch and mom receptive. Baby woke up briefly , attempted to latch and back to sleep . STS with on moms chest .  LC reviewed BF goals for 24 hours - feed with cues and by 3 hours if baby isn't awake check the diaper, change if needed and offered the breast.  LC encouraged to call with feeding cues and mom is aware the RN or LC can do a latch assist and assessment.   Maternal Data Has patient been taught Hand Expression?:  (per mom remembers how to hand express) Does the patient have breastfeeding experience prior to this delivery?: Yes How long did the patient breastfeed?: per mom BF her at 6 weeks  Feeding Mother's Current Feeding Choice: Breast Milk  LATCH Score Latch: Too sleepy or reluctant, no latch achieved, no sucking elicited.  Audible Swallowing: None  Type of Nipple: Everted at rest and after stimulation  Comfort (Breast/Nipple): Soft / non-tender  Hold (Positioning): Assistance needed to correctly position infant at breast and maintain latch.  LATCH Score: 5    Interventions Interventions: Breast feeding basics reviewed;Assisted with latch;Skin to skin;Adjust position;Support pillows;Position options;Education;LC Services brochure  Discharge Pump: Personal;Hands Free;DEBP WIC Program: No  Consult Status Consult Status: Follow-up Date: 01/31/23 Follow-up type: In-patient    Matilde Sprang Kaeley Vinje 01/31/2023, 12:11 PM

## 2023-01-31 NOTE — Progress Notes (Signed)
Subjective: Postpartum Day 1: Cesarean Delivery Patient is doing well this morning. Pain is controlled. Ambulating, voiding, tolerating PO. Minimal lochia.   Objective: Patient Vitals for the past 24 hrs:  BP Temp Temp src Pulse Resp SpO2 Height Weight  01/31/23 0706 107/71 98.2 F (36.8 C) Oral 68 17 100 % -- --  01/31/23 0330 117/73 98.3 F (36.8 C) Oral 74 17 100 % -- --  01/30/23 2340 -- 98.5 F (36.9 C) Oral -- 17 99 % -- --  01/30/23 1930 104/70 (!) 97.5 F (36.4 C) Axillary 71 17 100 % -- --  01/30/23 1836 115/81 (!) 96 F (35.6 C) Oral 88 18 99 % -- --  01/30/23 1730 117/66 (!) 96 F (35.6 C) Oral 65 18 99 % -- --  01/30/23 1629 112/67 (!) 96 F (35.6 C) Axillary 61 18 100 % -- --  01/30/23 1530 123/65 (!) 96.6 F (35.9 C) Oral 60 20 100 % -- --  01/30/23 1430 126/82 -- -- 75 (!) 23 100 % -- --  01/30/23 1415 127/67 -- -- 78 16 100 % -- --  01/30/23 1400 127/74 -- -- 81 14 99 % -- --  01/30/23 1345 120/70 97.6 F (36.4 C) Oral 73 13 100 % -- --  01/30/23 0929 (!) 125/96 98.1 F (36.7 C) Oral -- 18 -- 5\' 1"  (1.549 m) 107 kg    Physical Exam:  General: alert, cooperative, and no distress Lochia: appropriate Uterine Fundus: firm Incision: healing well, no significant drainage, no dehiscence, no significant erythema DVT Evaluation: No evidence of DVT seen on physical exam.  Recent Labs    01/31/23 0531  HGB 9.4*  HCT 28.0*    Assessment/Plan: Verdia Kuba G2P2002 POD#1 sp repeat cesarean at [redacted]w[redacted]d 1. PPC: routine PP care 2. Rh neg, baby Rh pos: Rhogam 3. ABLA: asymptomatic, PO iron 4.  Desires neonatal circumcision, R/B/A of procedure discussed at length. Pt understands that neonatal circumcision is not considered medically necessary and is elective. The risks include, but are not limited to bleeding, infection, damage to the penis, development of scar tissue, and having to have it redone at a later date. Pt understands theses risks and wishes to proceed.  5.  Dispo: anticipate discharge tomorrow  Charlett Nose 01/31/2023, 7:54 AM

## 2023-01-31 NOTE — Lactation Note (Signed)
This note was copied from a baby's chart. Lactation Consultation Note  Patient Name: Kristine [name] [id number] Date: 01/31/2023 Age:32 hours, 2nd LC visit to check the latch Reason for consult: Follow-up assessment Baby partially STS and latched after several attempts and assistance  With depth. LC explained to mom the importance of making sure the baby obtains the depth at the breast and he opens wide prior to latch.  Mom expressed comfort with the feeding and baby still feeding.  Latch score 7   Maternal Data Has patient been taught Hand Expression?:  (per mom remembers how to hand express) Does the patient have breastfeeding experience prior to this delivery?: Yes How long did the patient breastfeed?: per mom BF her at 6 weeks  Feeding Mother's Current Feeding Choice: Breast Milk  LATCH Score Latch: Repeated attempts needed to sustain latch, nipple held in mouth throughout feeding, stimulation needed to elicit sucking reflex.  Audible Swallowing: A few with stimulation  Type of Nipple: Everted at rest and after stimulation  Comfort (Breast/Nipple): Soft / non-tender  Hold (Positioning): Assistance needed to correctly position infant at breast and maintain latch.  LATCH Score: 7    Interventions Interventions: Breast feeding basics reviewed;Assisted with latch;Skin to skin;Breast massage;Hand express;Breast compression;Adjust position;Support pillows;Position options;Education  Discharge Pump: Personal;Hands Free;DEBP WIC Program: No  Consult Status Consult Status: Follow-up Date: 02/01/23 Follow-up type: In-patient    [name] 01/31/2023, 1:14 PM

## 2023-01-31 NOTE — Progress Notes (Signed)
Patient's foley dc'd. Encouraged patient to try to void within next 3 hours and to notify staff if she voids in urine hat or not.

## 2023-02-01 LAB — RH IG WORKUP (INCLUDES ABO/RH)

## 2023-02-01 MED ORDER — IBUPROFEN 600 MG PO TABS: 600.0000 mg | ORAL_TABLET | Freq: Four times a day (QID) | ORAL | 0 refills | Status: DC

## 2023-02-01 MED ORDER — OXYCODONE HCL 5 MG PO TABS: 5.0000 mg | ORAL_TABLET | Freq: Four times a day (QID) | ORAL | 0 refills | Status: DC | PRN

## 2023-02-01 NOTE — Discharge Instructions (Signed)
You may wash incision with soap and water.    Do not soak the incision for 2 weeks (no tub baths or swimming).   Keep incision dry.  If you note drainage, increased pain, or increased redness of the incision, then please notify your physician.  Take ibuprofen 600 mg and acetaminophen (tylenol) 1000 mg every 6 hours.  Oxycodone as needed only for severe break through pain

## 2023-02-01 NOTE — Progress Notes (Addendum)
Patient is doing well.  She is tolerating PO, ambulating, voiding.  Pain is controlled.  Lochia is appropriate  Vitals:   01/31/23 0706 01/31/23 1356 01/31/23 2119 02/01/23 0515  BP: 107/71 (!) 111/94 124/64 111/67  Pulse: 68 71 79 82  Resp: 17 18 18 17   Temp: 98.2 F (36.8 C) 98.2 F (36.8 C) 98.9 F (37.2 C) 98.4 F (36.9 C)  TempSrc: Oral Oral Oral Oral  SpO2: 100%   100%  Weight:      Height:        NAD Abdomen:  soft, appropriate tenderness, incisions intact and without erythema or drainage ext:    Symmetric, 1+ edema bilaterally  Lab Results  Component Value Date   WBC 13.3 (H) 01/31/2023   HGB 9.4 (L) 01/31/2023   HCT 28.0 (L) 01/31/2023   MCV 91.8 01/31/2023   PLT 167 01/31/2023    --/--/A NEG (07/03 0949)/  A/P    32 y.o. [id number] POD#2 s/p scheduled RCS Routine post op and postpartum care.   Rh negative: s/p rhogam ABLA: asymptomatic, on PO iron  Meeting all goals.  Discharge to home today.

## 2023-02-01 NOTE — Lactation Note (Signed)
This note was copied from a baby's chart. Lactation Consultation Note  Patient Name: Boy Renica Crail [id number] Date: 02/01/2023 Age:32 hours Reason for consult: Follow-up assessment;Infant weight loss;Breastfeeding assistance (10 % weight loss) LC reviewed the doc flow sheets with mom , and confirmed the number of wets and stools.  Baby awake and LC offered to assist to latch right breast, football.  Mom mentioned she still has swelling in her hands and positioning is difficult. LC worked with mom for adequate support and per mom more comfortable.  LC suspects depth not always being achieved as contributed to 10 % weight loss.  Mom reports her breast feel heavier and her milk is coming in.  When LC was assisting , ease to hand express steady flow of milk.  Baby latched with depth, fed for 5 mins , released and LC assisted to increase depth and baby still feeding.  Due to weight loss - LC recommended 2 options after hand expressing, pre pump to prime the milk ducts if needed, Option #2 - post pump after 3-4 feedings 10 - 15 mins when baby isn't cluster feeding until milk comes in.  LC reviewed BF D/C teaching and the [facility] resources.   Maternal Data Has patient been taught Hand Expression?: Yes Does the patient have breastfeeding experience prior to this delivery?: Yes How long did the patient breastfeed?: 6 weeks  Feeding Mother's Current Feeding Choice: Breast Milk  LATCH Score Latch: Grasps breast easily, tongue down, lips flanged, rhythmical sucking.  Audible Swallowing: Spontaneous and intermittent  Type of Nipple: Everted at rest and after stimulation  Comfort (Breast/Nipple): Filling, red/small blisters or bruises, mild/mod discomfort  Hold (Positioning): Assistance needed to correctly position infant at breast and maintain latch.  LATCH Score: 8   Lactation Tools Discussed/Used Tools: Shells;Pump;Flanges Flange Size: 21;24 Breast pump type: Manual Pump Education: Setup,  frequency, and cleaning;Milk Storage  Interventions Interventions: Breast feeding basics reviewed;Assisted with latch;Skin to skin;Breast massage;Hand express;Pre-pump if needed;Reverse pressure;Breast compression;Adjust position;Support pillows;Position options;Shells;Hand pump;Education;LC Services brochure  Discharge Discharge Education: Engorgement and breast care;Warning signs for feeding baby Pump: Personal;Hands Free;Manual  Consult Status Consult Status: Complete Date: 02/01/23    Matilde Sprang Enmanuel Zufall 02/01/2023, 9:02 AM

## 2023-02-01 NOTE — Discharge Summary (Signed)
Postpartum Discharge Summary  Date of Service updated 02/01/23     Patient Name: Kristine [name] DOB: June 24, 1991 MRN: [id number]  Date of admission: 01/30/2023 Delivery date:01/30/2023  Delivering provider: Edwinna Areola  Date of discharge: 02/01/2023  Admitting diagnosis: Previous cesarean delivery affecting pregnancy [O34.219] Intrauterine pregnancy: [redacted]w[redacted]d     Secondary diagnosis:  Principal Problem:   Status post repeat low transverse cesarean section  Additional problems: NOne    Discharge diagnosis: Term Pregnancy Delivered                                              Post partum procedures:rhogam Augmentation: N/A Complications: None  Hospital course: Sceduled C/S   32 y.o. yo G2P2002 at 108w1d was admitted to the hospital 01/30/2023 for scheduled cesarean section with the following indication:Elective Repeat.Delivery details are as follows:  Membrane Rupture Time/Date: 12:54 PM ,01/30/2023   Delivery Method:C-Section, Low Transverse  Details of operation can be found in separate operative note.  Patient had a postpartum course complicated by n/a.  She is ambulating, tolerating a regular diet, passing flatus, and urinating well. Patient is discharged home in stable condition on  02/01/23        Newborn Data: Birth date:01/30/2023  Birth time:12:55 PM  Gender:Female  Living status:Living  Apgars:8 ,9  Weight:3130 g      Rhophylac:Yes   Physical exam  Vitals:   01/31/23 0706 01/31/23 1356 01/31/23 2119 02/01/23 0515  BP: 107/71 (!) 111/94 124/64 111/67  Pulse: 68 71 79 82  Resp: 17 18 18 17   Temp: 98.2 F (36.8 C) 98.2 F (36.8 C) 98.9 F (37.2 C) 98.4 F (36.9 C)  TempSrc: Oral Oral Oral Oral  SpO2: 100%   100%  Weight:      Height:       General: alert, cooperative, and no distress Lochia: appropriate Uterine Fundus: firm Incision: Dressing is clean, dry, and intact DVT Evaluation: No evidence of DVT seen on physical exam. Labs: Lab Results  Component  Value Date   WBC 13.3 (H) 01/31/2023   HGB 9.4 (L) 01/31/2023   HCT 28.0 (L) 01/31/2023   MCV 91.8 01/31/2023   PLT 167 01/31/2023      Latest Ref Rng & Units 08/05/2016   10:30 AM  CMP  Glucose 70 - 99 mg/dL 86   BUN 6 - 23 mg/dL 6   Creatinine 4.09 - 8.11 mg/dL 9.14   Sodium 782 - 956 mEq/L 139   Potassium 3.5 - 5.1 mEq/L 4.0   Chloride 96 - 112 mEq/L 109   CO2 19 - 32 mEq/L 24   Calcium 8.4 - 10.5 mg/dL 8.9   Total Protein 6.0 - 8.3 g/dL 7.0   Total Bilirubin 0.2 - 1.2 mg/dL 0.5   Alkaline Phos 39 - 117 U/L 45   AST 0 - 37 U/L 10   ALT 0 - 35 U/L 5    Edinburgh Score:     No data to display            After visit meds:  Allergies as of 02/01/2023       Reactions   Bactroban  [mupirocin] Hives   Bactrim [sulfamethoxazole-trimethoprim] Rash        Medication List     STOP taking these medications    butalbital-acetaminophen-caffeine 50-325-40 MG tablet Commonly known as: FIORICET  terconazole 0.8 % vaginal cream Commonly known as: TERAZOL 3       TAKE these medications    ibuprofen 600 MG tablet Commonly known as: ADVIL Take 1 tablet (600 mg total) by mouth every 6 (six) hours.   oxyCODONE 5 MG immediate release tablet Commonly known as: Oxy IR/ROXICODONE Take 1 tablet (5 mg total) by mouth every 6 (six) hours as needed for severe pain.   PRENATAL GUMMIES PO Take 2 capsules by mouth daily.         Discharge home in stable condition Infant Feeding: Breast Infant Disposition:home with mother Discharge instruction: per After Visit Summary and Postpartum booklet. Activity: Advance as tolerated. Pelvic rest for 6 weeks.  Diet: routine diet Anticipated Birth Control: Unsure Postpartum Appointment:6 weeks Additional Postpartum F/U: Incision check 2 weeks Future Appointments:No future appointments. Follow up Visit:  Follow-up Information     Edwinna Areola, DO Follow up in 2 day(s).   Specialty: Obstetrics and  Gynecology Contact information: [address] [place] [address] [place] [address] [phone]                     02/01/2023 [facility] Lizabeth Leyden, MD

## 2023-02-02 LAB — RH IG WORKUP (INCLUDES ABO/RH)
Antibody Screen: POSITIVE
Fetal Screen: NEGATIVE

## 2023-02-23 ENCOUNTER — Telehealth (HOSPITAL_COMMUNITY): Payer: Self-pay | Admitting: *Deleted

## 2023-02-23 NOTE — Telephone Encounter (Signed)
Attempted hospital discharge follow-up call. Left message for patient to return RN call with any questions or concerns. Deforest Hoyles, RN, 02/23/23, [phone]

## 2024-01-07 ENCOUNTER — Encounter: Payer: Self-pay | Admitting: Family

## 2024-01-07 ENCOUNTER — Ambulatory Visit (INDEPENDENT_AMBULATORY_CARE_PROVIDER_SITE_OTHER): Admitting: Family

## 2024-01-07 VITALS — BP 134/84 | HR 75 | Temp 98.1°F | Ht 61.0 in | Wt 211.8 lb

## 2024-01-07 DIAGNOSIS — G43009 Migraine without aura, not intractable, without status migrainosus: Secondary | ICD-10-CM | POA: Insufficient documentation

## 2024-01-07 DIAGNOSIS — G5601 Carpal tunnel syndrome, right upper limb: Secondary | ICD-10-CM | POA: Insufficient documentation

## 2024-01-07 NOTE — Progress Notes (Signed)
 New Patient Office Visit  Subjective:  Patient ID: Kristine [name], female    DOB: September 04, 1990  Age: 33 y.o. MRN: [id number]  CC:  Chief Complaint  Patient presents with   Establish Care   Wrist Pain    Pt state dthta she has been having rt wrist for the past year and it has not gotten any better. Also has some concern with the depo for 5/6 years and heaed of some side effects that it causes and wanted to know does she fall in the categories    HPI Kristine [name] presents for establishing care today.  Discussed the use of AI scribe software for clinical note transcription with the patient, who gave verbal consent to proceed.  History of Present Illness Kristine [name] is a 33 year old female who presents with wrist pain and a history of migraines.  Kristine [name] experiences wrist pain primarily in her right wrist, initially injured in high school. The pain worsens with activity, particularly with hand use, and is tender to touch on both sides of the wrist. She experiences tingling and numbness in her fingers, which she alleviates by tapping her hand. She uses a brace with a hard plate during the day and at night for about a week when symptoms flare up, approximately once a month. She takes over-the-counter medications for relief. 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 She frequently lifts her young son, which may also contribute to her symptoms.  She has migraines that began after using Depo-Provera in 2011, with the last episode occurring about a month ago. Migraines are characterized by pain on the left side of her head, including her temple but not the eye, triggered by fluorescent lights, and are managed by turning off lights and taking Excedrin Extra Strength. Associated symptoms include nausea and dizziness.   Assessment & Plan Carpal Tunnel Syndrome - Right wrist pain with numbness and tingling, consistent with carpal  tunnel syndrome. Symptoms managed with bracing and medication. Goal to manage symptoms and delay surgery unless symptoms worsen. Discussed ergonomic aids and exercises. - Continue wrist brace during flare-ups while working and sleeping. - Use Aleve, one or two tablets twice a day for no more than five to seven days. - Apply ice during flare-ups for up to 20minutes 3x/day. - Avoid heavy lifting during flare-ups. - Use wrist pad and ergonomic mouse at work. - Provided handout for exercises to strengthen wrist. - F/U prn  Migraine - Migraines occur monthly, managed with Excedrin. Reassured low risk of brain tumors from past Depo-Provera use. Per [employer] medical resource, there is 0.05% risk if Depo used for more than 1 year. - Continue Excedrin for relief. - Monitor frequency and severity. - Inform provider if Excedrin becomes ineffective or migraines are becoming more frequent. - F/U prn  General Health Maintenance No physical exam with labs in over a year. - Schedule physical exam with lab work, including cholesterol screening.  Subjective:    Outpatient Medications Prior to Visit  Medication Sig Dispense Refill   Prenatal MV & Min w/FA-DHA (PRENATAL GUMMIES PO) Take 2 capsules by mouth daily.     ibuprofen  (ADVIL ) 600 MG tablet Take 1 tablet (600 mg total) by mouth every 6 (six) hours. 60 tablet 0   oxyCODONE  (OXY IR/ROXICODONE ) 5 MG immediate release tablet Take 1 tablet (5 mg total) by mouth every 6 (six) hours as needed for severe pain. 6 tablet  0   No facility-administered medications prior to visit.   Past Medical History:  Diagnosis Date   Acute blood loss anemia 04/17/2014   History of cesarean section 01/06/2016   Menorrhagia 04/17/2014   MRSA infection 2017   right upper thigh   Pain in left foot 12/22/2017   S/P cesarean section 01/06/2016   Status post repeat low transverse cesarean section 01/30/2023   STD (sexually transmitted disease) 03/2014   Chlamydia   Past  Surgical History:  Procedure Laterality Date   BONE EXCISION Left 08/18/2018   Procedure: Excision of Accessory Navicular;  Surgeon: Amada Backer, MD;  Location: [facility];  Service: Orthopedics;  Laterality: Left;   CESAREAN SECTION N/A 01/06/2016   Procedure: CESAREAN SECTION;  Surgeon: Margaretmary Shaver, MD;  Location: [facility];  Service: Obstetrics;  Laterality: N/A;   CESAREAN SECTION N/A 01/30/2023   Procedure: CESAREAN SECTION;  Surgeon: Loa Riling, DO;  Location: [facility];  Service: Obstetrics;  Laterality: N/A;  request OB fellow as assist   GASTROCNEMIUS RECESSION Left 08/18/2018   Procedure: Left gastroc recession;  Surgeon: Amada Backer, MD;  Location: [facility];  Service: Orthopedics;  Laterality: Left;   TENDON TRANSFER Left 08/18/2018   Procedure: Transfer of Posterior Tibial Tendon;  Surgeon: Amada Backer, MD;  Location: [facility];  Service: Orthopedics;  Laterality: Left;   WISDOM TOOTH EXTRACTION      Objective:   Today's Vitals: BP 134/84   Pulse 75   Temp 98.1 F (36.7 C)   Ht 5' 1 (1.549 m)   Wt 211 lb 12.8 oz (96.1 kg)   SpO2 100%   BMI 40.02 kg/m   Physical Exam Vitals and nursing note reviewed.  Constitutional:      Appearance: Normal appearance. She is obese.   Cardiovascular:     Rate and Rhythm: Normal rate.  Pulmonary:     Effort: Pulmonary effort is normal.     Breath sounds: Normal breath sounds.   Musculoskeletal:        General: Normal range of motion.     Right wrist: Tenderness and bony tenderness present. No swelling. Normal range of motion.   Skin:    General: Skin is warm and dry.   Neurological:     Mental Status: She is alert.   Psychiatric:        Mood and Affect: Mood normal.        Behavior: Behavior normal.     Versa Gore, NP

## 2024-01-07 NOTE — Assessment & Plan Note (Signed)
 Migraines occur monthly, managed with Excedrin. Reassured low risk of brain tumors from past Depo-Provera use. Per [employer] medical resource, there is 0.05% risk if Depo used for more than 1 year. - Continue Excedrin for relief. - Monitor frequency and severity. - Inform provider if Excedrin becomes ineffective or migraines are becoming more frequent. - F/U prn

## 2024-01-07 NOTE — Assessment & Plan Note (Addendum)
 Right wrist pain with numbness and tingling, consistent with carpal tunnel syndrome. Symptoms managed with bracing and medication. Goal to manage symptoms and delay surgery unless symptoms worsen. Discussed ergonomic aids and exercises. - Continue wrist brace during flare-ups while working & sleeping. - Use Aleve, one or two tablets twice a day for no more than five to seven days. - Apply ice during flare-ups for up to 20minutes 3x/day. - Avoid heavy lifting during flare-ups. - Use wrist pad and ergonomic mouse at work. - Provided handout for exercises to strengthen wrist. - F/U prn

## 2024-01-07 NOTE — Patient Instructions (Addendum)
 Welcome to [facility], It was a pleasure meeting you today!   As discussed, for your carpal tunnel syndrome in your right wrist, continue to wear the brace, ok to take generic Aleve twice a day up to a week, apply ice for up to 20 minutes 3 times per day. Be sure to use a wrist pad for computer work. Look for an ergonomic mouse to use. Let me know if symptoms worsen or occur more frequently.  Please schedule a physical with fasting labs when it is convenient for you. I will investigate more about the risk of having had Depo shots and brain tumors and let you know.    PLEASE NOTE: If you had any LAB tests please let us  know if you have not heard back within a few days. You may see your results on MyChart before we have a chance to review them but we will give you a call once they are reviewed by us . If we ordered any REFERRALS today, please let us  know if you have not heard from their office within the next week.  Let us  know through MyChart if you are needing REFILLS, or have your pharmacy send us  the request. You can also use MyChart to communicate with me or any office staff.  Please try these tips to maintain a healthy lifestyle: It is important that you exercise regularly at least 30 minutes 5 times a week. Think about what you will eat, plan ahead. Choose whole foods, & think  clean, green, fresh or frozen over canned, processed or packaged foods which are more sugary, salty, and fatty. 70 to 75% of food eaten should be fresh vegetables and protein. 2-3  meals daily with healthy snacks between meals, but must be whole fruit, protein or vegetables. Aim to eat over a 10 hour period when you are active, for example, 7am to 5pm, and then STOP after your last meal of the day, drinking only water.  Shorter eating windows, 6-8 hours, are showing benefits in heart disease and blood sugar regulation. Drink water every day! Shoot for 64 ounces daily = 8 cups, no  other drink is as healthy! Fruit juice is best enjoyed in a healthy way, by EATING the fruit.

## 2024-02-10 ENCOUNTER — Encounter: Admitting: Family

## 2024-02-23 ENCOUNTER — Encounter: Admitting: Family
